# Patient Record
Sex: Male | Born: 2000 | Race: White | Hispanic: No | Marital: Single | Smoking: Never smoker
Health system: Southern US, Community
[De-identification: ages and names within clinical notes are randomized; demographics above are authoritative.]

## PROBLEM LIST (undated history)

## (undated) DIAGNOSIS — K219 Gastro-esophageal reflux disease without esophagitis: Secondary | ICD-10-CM

## (undated) DIAGNOSIS — M7989 Other specified soft tissue disorders: Secondary | ICD-10-CM

## (undated) DIAGNOSIS — R519 Headache, unspecified: Secondary | ICD-10-CM

## (undated) DIAGNOSIS — M549 Dorsalgia, unspecified: Secondary | ICD-10-CM

## (undated) DIAGNOSIS — M2550 Pain in unspecified joint: Secondary | ICD-10-CM

## (undated) HISTORY — DX: Gastro-esophageal reflux disease without esophagitis: K21.9

## (undated) HISTORY — DX: Headache, unspecified: R51.9

## (undated) HISTORY — DX: Other specified soft tissue disorders: M79.89

## (undated) HISTORY — DX: Dorsalgia, unspecified: M54.9

## (undated) HISTORY — DX: Pain in unspecified joint: M25.50

---
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
## 2018-03-22 DIAGNOSIS — Z6832 Body mass index (BMI) 32.0-32.9, adult: Secondary | ICD-10-CM | POA: Diagnosis not present

## 2018-03-22 DIAGNOSIS — Z1331 Encounter for screening for depression: Secondary | ICD-10-CM | POA: Diagnosis not present

## 2018-03-22 DIAGNOSIS — R591 Generalized enlarged lymph nodes: Secondary | ICD-10-CM | POA: Diagnosis not present

## 2018-09-13 DIAGNOSIS — H6693 Otitis media, unspecified, bilateral: Secondary | ICD-10-CM | POA: Diagnosis not present

## 2019-06-22 DIAGNOSIS — Z20828 Contact with and (suspected) exposure to other viral communicable diseases: Secondary | ICD-10-CM | POA: Diagnosis not present

## 2019-06-22 DIAGNOSIS — R519 Headache, unspecified: Secondary | ICD-10-CM | POA: Diagnosis not present

## 2019-06-22 DIAGNOSIS — R5383 Other fatigue: Secondary | ICD-10-CM | POA: Diagnosis not present

## 2019-07-04 DIAGNOSIS — B079 Viral wart, unspecified: Secondary | ICD-10-CM | POA: Diagnosis not present

## 2019-07-04 DIAGNOSIS — D485 Neoplasm of uncertain behavior of skin: Secondary | ICD-10-CM | POA: Diagnosis not present

## 2019-07-17 DIAGNOSIS — Z13220 Encounter for screening for lipoid disorders: Secondary | ICD-10-CM | POA: Diagnosis not present

## 2019-07-17 DIAGNOSIS — Z23 Encounter for immunization: Secondary | ICD-10-CM | POA: Diagnosis not present

## 2019-07-17 DIAGNOSIS — Z0000 Encounter for general adult medical examination without abnormal findings: Secondary | ICD-10-CM | POA: Diagnosis not present

## 2019-07-17 DIAGNOSIS — Z6833 Body mass index (BMI) 33.0-33.9, adult: Secondary | ICD-10-CM | POA: Diagnosis not present

## 2019-07-18 DIAGNOSIS — B079 Viral wart, unspecified: Secondary | ICD-10-CM | POA: Diagnosis not present

## 2019-08-18 DIAGNOSIS — R748 Abnormal levels of other serum enzymes: Secondary | ICD-10-CM | POA: Diagnosis not present

## 2019-08-18 DIAGNOSIS — R7989 Other specified abnormal findings of blood chemistry: Secondary | ICD-10-CM | POA: Diagnosis not present

## 2019-08-26 DIAGNOSIS — Z20828 Contact with and (suspected) exposure to other viral communicable diseases: Secondary | ICD-10-CM | POA: Diagnosis not present

## 2019-08-26 DIAGNOSIS — J3489 Other specified disorders of nose and nasal sinuses: Secondary | ICD-10-CM | POA: Diagnosis not present

## 2019-10-01 DIAGNOSIS — L299 Pruritus, unspecified: Secondary | ICD-10-CM | POA: Diagnosis not present

## 2019-10-01 DIAGNOSIS — L309 Dermatitis, unspecified: Secondary | ICD-10-CM | POA: Diagnosis not present

## 2021-05-28 HISTORY — PX: FOOT FRACTURE SURGERY: SHX645

## 2021-06-26 ENCOUNTER — Emergency Department
Admission: EM | Admit: 2021-06-26 | Discharge: 2021-06-26 | Disposition: A | Discharge status: Home / Self Care | Payer: No Typology Code available for payment source | Attending: Emergency Medicine | Admitting: Emergency Medicine

## 2021-06-26 ENCOUNTER — Emergency Department: Payer: No Typology Code available for payment source

## 2021-06-26 ENCOUNTER — Other Ambulatory Visit: Payer: Self-pay

## 2021-06-26 ENCOUNTER — Encounter: Payer: Self-pay | Admitting: Emergency Medicine

## 2021-06-26 DIAGNOSIS — S99912A Unspecified injury of left ankle, initial encounter: Secondary | ICD-10-CM | POA: Diagnosis present

## 2021-06-26 DIAGNOSIS — Y92331 Roller skating rink as the place of occurrence of the external cause: Secondary | ICD-10-CM | POA: Diagnosis not present

## 2021-06-26 DIAGNOSIS — S8262XA Displaced fracture of lateral malleolus of left fibula, initial encounter for closed fracture: Secondary | ICD-10-CM | POA: Diagnosis not present

## 2021-06-26 DIAGNOSIS — M25572 Pain in left ankle and joints of left foot: Secondary | ICD-10-CM

## 2021-06-26 DIAGNOSIS — Y9351 Activity, roller skating (inline) and skateboarding: Secondary | ICD-10-CM | POA: Insufficient documentation

## 2021-06-26 MED ORDER — OXYCODONE-ACETAMINOPHEN 5-325 MG PO TABS: 1.0000 | ORAL_TABLET | Freq: Four times a day (QID) | ORAL | 0 refills | Status: AC | PRN

## 2021-06-26 MED ORDER — ONDANSETRON 4 MG PO TBDP: 4.0000 mg | ORAL_TABLET | Freq: Three times a day (TID) | ORAL | 0 refills | Status: AC | PRN

## 2021-06-26 NOTE — Discharge Instructions (Signed)
You can take Percocet for pain. Please start stool softener.

## 2021-06-26 NOTE — ED Triage Notes (Signed)
Pt reports fell off a skateboard last pm and hurt his left ankle. Pt not able to put weight on it at this time.

## 2021-06-26 NOTE — ED Provider Notes (Signed)
[facility]-EMERGENCY DEPARTMENT  ____________________________________________  Time seen: Approximately 7:18 PM  I have reviewed the triage vital signs and the nursing notes.   HISTORY  Chief Complaint Ankle Pain   Historian Patient    HPI Larry [name] is a 20 y.o. male presents to the emergency department with acute left ankle pain after an inversion type ankle injury while patient was skateboarding in [place].  Patient has had difficulty bearing weight since injury occurred.  No abrasions or lacerations.  No other alleviating measures have been attempted.   History reviewed. No pertinent past medical history.   Immunizations up to date:  Yes.     History reviewed. No pertinent past medical history.  There are no problems to display for this patient.   History reviewed. No pertinent surgical history.  Prior to Admission medications   Medication Sig Start Date End Date Taking? Authorizing Provider  ondansetron (ZOFRAN ODT) 4 MG disintegrating tablet Take 1 tablet (4 mg total) by mouth every 8 (eight) hours as needed for up to 5 days. 06/26/21 07/01/21 Yes Pia Mau M, PA-C  oxyCODONE-acetaminophen (PERCOCET/ROXICET) 5-325 MG tablet Take 1 tablet by mouth every 6 (six) hours as needed for up to 3 days. 06/26/21 06/29/21 Yes Orvil Feil, PA-C    Allergies Patient has no allergy information on record.  No family history on file.  Social History     Review of Systems  Constitutional: No fever/chills Eyes:  No discharge ENT: No upper respiratory complaints. Respiratory: no cough. No SOB/ use of accessory muscles to breath Gastrointestinal:   No nausea, no vomiting.  No diarrhea.  No constipation. Musculoskeletal: Patient has left ankle pain.  Skin: Negative for rash, abrasions, lacerations, ecchymosis.    ____________________________________________   PHYSICAL EXAM:  VITAL SIGNS: ED Triage Vitals  Enc Vitals Group     BP 06/26/21 1700 (!)  157/91     Pulse Rate 06/26/21 1700 78     Resp 06/26/21 1700 20     Temp 06/26/21 1700 98.5 F (36.9 C)     Temp Source 06/26/21 1700 Oral     SpO2 06/26/21 1700 98 %     Weight --      Height --      Head Circumference --      Peak Flow --      Pain Score 06/26/21 1531 8     Pain Loc --      Pain Edu? --      Excl. in GC? --      Constitutional: Alert and oriented. Well appearing and in no acute distress. Eyes: Conjunctivae are normal. PERRL. EOMI. Head: Atraumatic. ENT:      Ears:       Nose: No congestion/rhinnorhea.      Mouth/Throat: Mucous membranes are moist.  Neck: No stridor.  No cervical spine tenderness to palpation. Cardiovascular: Normal rate, regular rhythm. Normal S1 and S2.  Good peripheral circulation. Respiratory: Normal respiratory effort without tachypnea or retractions. Lungs CTAB. Good air entry to the bases with no decreased or absent breath sounds Gastrointestinal: Bowel sounds x 4 quadrants. Soft and nontender to palpation. No guarding or rigidity. No distention. Musculoskeletal: Patient has pain with palpation over the lateral and medial aspect the left ankle.  Palpable dorsalis pedis pulse bilaterally and symmetrically.  Capillary refill less than 2 seconds on the left. Neurologic:  Normal for age. No gross focal neurologic deficits are appreciated.  Skin:  Skin is warm, dry and intact. No rash  noted. Psychiatric: Mood and affect are normal for age. Speech and behavior are normal.   ____________________________________________   LABS (all labs ordered are listed, but only abnormal results are displayed)  Labs Reviewed - No data to display ____________________________________________  EKG   ____________________________________________  RADIOLOGY Geraldo Pitter, personally viewed and evaluated these images (plain radiographs) as part of my medical decision making, as well as reviewing the written report by the radiologist.  DG Ankle  Complete Left  Result Date: 06/26/2021 CLINICAL DATA:  Status post fall, ankle pain EXAM: LEFT ANKLE COMPLETE - 3+ VIEW COMPARISON:  None. FINDINGS: Transverse fracture of the medial malleolus with 5 mm of lateral displacement. Oblique fracture of the distal fibular diaphysis with 4 mm of lateral displacement. 6 mm subluxation of the talar dome relative to the tibial plafond. No aggressive osseous lesion. Soft tissue swelling around the ankle. No radiopaque foreign body or soft tissue emphysema. IMPRESSION: 1. Transverse fracture of the medial malleolus with 5 mm of lateral displacement. 2. Oblique fracture of the distal fibular diaphysis with 4 mm of lateral displacement. 3. 6 mm lateral subluxation of the talar dome relative to the tibial plafond. Electronically Signed   By: Elige Ko M.D.   On: 06/26/2021 16:31    ____________________________________________    PROCEDURES  Procedure(s) performed:     Procedures     Medications - No data to display   ____________________________________________   INITIAL IMPRESSION / ASSESSMENT AND PLAN / ED COURSE  Pertinent labs & imaging results that were available during my care of the patient were reviewed by me and considered in my medical decision making (see chart for details).      Assessment and plan Ankle pain 20 year old male presents to the emergency department with acute left ankle pain after an inversion type ankle injury.  X-ray of the left ankle shows a transverse fracture of the medial malleolus and oblique fracture of the distal fibula.  I consulted on-call orthopedist, Dr. Allena Katz who recommended consult with podiatry.  I spoke with on-call podiatrist, Dr. Loreta Ave who agreed to see patient as an outpatient.  Percocet was prescribed for pain and patient was placed in a cam boot and crutches were provided.  All patient questions were answered.     ____________________________________________  FINAL CLINICAL IMPRESSION(S) /  ED DIAGNOSES  Final diagnoses:  Acute left ankle pain      NEW MEDICATIONS STARTED DURING THIS VISIT:  ED Discharge Orders          Ordered    oxyCODONE-acetaminophen (PERCOCET/ROXICET) 5-325 MG tablet  Every 6 hours PRN        06/26/21 1731    ondansetron (ZOFRAN ODT) 4 MG disintegrating tablet  Every 8 hours PRN        06/26/21 1731                This chart was dictated using voice recognition software/Dragon. Despite best efforts to proofread, errors can occur which can change the meaning. Any change was purely unintentional.     Orvil Feil, PA-C 06/26/21 Kathi Ludwig, MD 06/27/21 [phone]

## 2021-06-29 ENCOUNTER — Telehealth: Payer: Self-pay | Admitting: Urology

## 2021-06-29 ENCOUNTER — Encounter: Payer: Self-pay | Admitting: Podiatry

## 2021-06-29 ENCOUNTER — Ambulatory Visit: Payer: No Typology Code available for payment source | Admitting: Podiatry

## 2021-06-29 ENCOUNTER — Other Ambulatory Visit: Payer: Self-pay

## 2021-06-29 DIAGNOSIS — S93432A Sprain of tibiofibular ligament of left ankle, initial encounter: Secondary | ICD-10-CM

## 2021-06-29 DIAGNOSIS — S82842A Displaced bimalleolar fracture of left lower leg, initial encounter for closed fracture: Secondary | ICD-10-CM

## 2021-06-29 MED ORDER — OXYCODONE HCL 5 MG PO TABS: 5.0000 mg | ORAL_TABLET | ORAL | 0 refills | Status: DC | PRN

## 2021-06-29 MED ORDER — ACETAMINOPHEN 500 MG PO TABS: 1000.0000 mg | ORAL_TABLET | Freq: Four times a day (QID) | ORAL | 0 refills | Status: DC | PRN

## 2021-06-29 NOTE — Progress Notes (Signed)
  Subjective:  Patient ID: Larry [name], male    DOB: February 22, 2001,  MRN: [id number]  Chief Complaint  Patient presents with   Fracture    (np) left foot fracutre, possible sx    20 y.o. male presents with the above complaint. History confirmed with patient.  He fell and rolled his ankle while skateboarding late Saturday early Sunday.  He went to [facility] on Sunday x-rays were taken and they diagnosed with an ankle fracture and referred to [place].  He has been in a cam boot.  Has not been elevating as much as he thinks he may should have but he did not receive many post discharge care instructions such as ice and elevation from the ER.  No compression dressing was applied  Objective:  Physical Exam: warm, good capillary refill, no trophic changes or ulcerative lesions, normal DP and PT pulses, normal sensory exam, and no evidence of compartment syndrome.  Ecchymosis about the foot and ankle with pain on the medial and lateral malleoli   Radiographs: Multiple views x-ray of left ankle reviewed from ER he has a PER pattern bimalleolar fracture with diastases of the syndesmosis and lateral translation of the talus Assessment:   1. Bimalleolar ankle fracture, left, closed, initial encounter   2. Ankle syndesmosis disruption, left, initial encounter      Plan:  Patient was evaluated and treated and all questions answered.  Reviewed the radiographic and clinical exam findings in detail with the patient and his mother.  Discussed that the ankle fracture he has would best be served with operative treatment.  Discussed the risk benefits and potential complications of this including but not limited to pain, swelling, infection, scar, numbness which may be temporary or permanent, chronic pain, stiffness, nerve pain or damage, wound healing problems, bone healing problems including delayed or non-union.  He had quite a bit of edema today I applied a Radio broadcast assistant and multilayer compression  dressing with alternating Webril and Ace wrap as well as a posterior slab and AO sugar-tong style plaster splint to the left ankle today, he has crutches and is doing well and then will be nonweightbearing until surgery.  We will schedule surgery for this Friday advised him to keep the operative leg elevated all times and ice behind the knee 20 minutes every hour until surgery.  Discussed with him and his mother that there is a possibility that we may need to cancel the surgery Friday if he is still very swollen and delay until next week which they understand but we will determine this in preop when I see him.  We also discussed postoperative course including the period of nonweightbearing from 4 to 6 weeks pending his progress as well as postoperative physical therapy.  Advised he will need at least 2 months off work following surgery he works in [employer].   Surgical plan:  Procedure: -ORIF bimalleolar ankle fracture with repair syndesmotic disruption  Location: -[facility]  Anesthesia plan: -General anesthesia with regional block  Postoperative pain plan: - Tylenol 1000 mg every 6 hours, ibuprofen 600 mg every 8 hours, gabapentin 300 mg every 8 hours x5 days, oxycodone 5 mg 1-2 tabs every 6 hours only as needed  DVT prophylaxis: -ASA 325 mg following surgery  WB Restrictions / DME needs: -NWB in posterior splint and compression dressing  No follow-ups on file.

## 2021-06-29 NOTE — Telephone Encounter (Signed)
DOS - 07/01/21  OPEN TREATMENT BIMALLEOLAR ANKLE FX LEFT --- 77412 OPEN TREATMENT DISTAL TIBIOFIBULAR JOINT LEFT --- 87867   AETNA EFFECTIVE DATE - 11/27/19  PER AETNA'S AUTOMATIVE SYSTEM FOR CPT CODES 67209 AND 47096 NO PRIOR AUTH IS REQUIRED.   REF # [id number]

## 2021-07-01 ENCOUNTER — Other Ambulatory Visit: Payer: Self-pay | Admitting: Podiatry

## 2021-07-01 ENCOUNTER — Encounter: Payer: Self-pay | Admitting: Podiatry

## 2021-07-01 DIAGNOSIS — S93432A Sprain of tibiofibular ligament of left ankle, initial encounter: Secondary | ICD-10-CM | POA: Diagnosis not present

## 2021-07-01 DIAGNOSIS — S82842A Displaced bimalleolar fracture of left lower leg, initial encounter for closed fracture: Secondary | ICD-10-CM | POA: Diagnosis not present

## 2021-07-01 MED ORDER — OXYCODONE HCL 5 MG PO TABS: 5.0000 mg | ORAL_TABLET | ORAL | 0 refills | Status: AC | PRN

## 2021-07-01 MED ORDER — IBUPROFEN 600 MG PO TABS: 600.0000 mg | ORAL_TABLET | Freq: Four times a day (QID) | ORAL | 0 refills | Status: AC | PRN

## 2021-07-01 MED ORDER — GABAPENTIN 300 MG PO CAPS: 300.0000 mg | ORAL_CAPSULE | Freq: Three times a day (TID) | ORAL | 0 refills | Status: DC

## 2021-07-01 MED ORDER — ACETAMINOPHEN 500 MG PO TABS: 1000.0000 mg | ORAL_TABLET | Freq: Four times a day (QID) | ORAL | 0 refills | Status: AC | PRN

## 2021-07-01 NOTE — Progress Notes (Signed)
11/4 left ankle ORIF

## 2021-07-06 ENCOUNTER — Other Ambulatory Visit: Payer: Self-pay

## 2021-07-06 ENCOUNTER — Ambulatory Visit (INDEPENDENT_AMBULATORY_CARE_PROVIDER_SITE_OTHER): Payer: No Typology Code available for payment source

## 2021-07-06 ENCOUNTER — Ambulatory Visit: Payer: No Typology Code available for payment source

## 2021-07-06 ENCOUNTER — Ambulatory Visit (INDEPENDENT_AMBULATORY_CARE_PROVIDER_SITE_OTHER): Payer: No Typology Code available for payment source | Admitting: Podiatry

## 2021-07-06 DIAGNOSIS — S82842A Displaced bimalleolar fracture of left lower leg, initial encounter for closed fracture: Secondary | ICD-10-CM

## 2021-07-06 DIAGNOSIS — S82852A Displaced trimalleolar fracture of left lower leg, initial encounter for closed fracture: Secondary | ICD-10-CM

## 2021-07-06 DIAGNOSIS — S93432A Sprain of tibiofibular ligament of left ankle, initial encounter: Secondary | ICD-10-CM

## 2021-07-07 ENCOUNTER — Encounter: Payer: Self-pay | Admitting: Podiatry

## 2021-07-07 NOTE — Progress Notes (Signed)
  Subjective:  Patient ID: Larry [name], male    DOB: [birth year],  MRN: [id number]  Chief Complaint  Patient presents with   Routine Post Op     (xrays)POV #1 DOS 07/01/2021 OPEN REDUCTION AND REPAIR OF LEFT ANKLE FRACTURE    DOS: 07/01/2021 Procedure: Left trimalleolar ORIF without posterior lip fixation, syndesmotic repair  20 y.o. male returns for post-op check.  Overall doing well he had quite bit of pain the first couple of days but he is quickly decreasing his narcotic demand.  Having bowel movements and normal sensation in the foot.  Review of Systems: Negative except as noted in the HPI. Denies N/V/F/Ch.   Objective:  There were no vitals filed for this visit. There is no height or weight on file to calculate BMI. Constitutional Well developed. Well nourished.  Vascular Foot warm and well perfused. Capillary refill normal to all digits.   Neurologic Normal speech. Oriented to person, place, and time. Epicritic sensation to light touch grossly present bilaterally.  No signs of CRPS 1 or 2, no signs of compartment syndrome.  Good motor function to digits  Dermatologic Skin healing well without signs of infection. Skin edges well coapted without signs of infection.  Moderate amount of edema and ecchymosis as expected  Orthopedic: Tenderness to palpation noted about the surgical site.   Multiple view plain film radiographs: Status post ORIF of fibular medial malleolar fracture with screw and plate fixation and syndesmotic flexible fixation.  Joint alignment appears maintained and in good position.  The butterfly fragment in the mid fibula appears to have displaced some Assessment:   1. Trimalleolar fracture of left ankle, closed, initial encounter   2. Ankle syndesmosis disruption, left, initial encounter    Plan:  Patient was evaluated and treated and all questions answered.  S/p foot surgery left -Progressing as expected post-operatively. -XR: As above.  I reviewed  the radiographs with patient and his father -WB Status: NWB in splint with knee scooter -Sutures: Remove sutures and staples next visit. -Medications: No refills required he is weaning his pain medications and will let me know if he needs any refills -Foot redressed.  Well-padded below-knee posterior splint was applied with a 2 layer Jones compression dressing.  Plan to transition to a cam boot and next visit to begin nonweightbearing range of motion  No follow-ups on file.

## 2021-07-20 ENCOUNTER — Encounter: Payer: Self-pay | Admitting: Podiatry

## 2021-07-20 ENCOUNTER — Other Ambulatory Visit: Payer: Self-pay

## 2021-07-20 ENCOUNTER — Ambulatory Visit (INDEPENDENT_AMBULATORY_CARE_PROVIDER_SITE_OTHER): Payer: No Typology Code available for payment source | Admitting: Podiatry

## 2021-07-20 ENCOUNTER — Ambulatory Visit (INDEPENDENT_AMBULATORY_CARE_PROVIDER_SITE_OTHER): Payer: No Typology Code available for payment source

## 2021-07-20 DIAGNOSIS — S82852D Displaced trimalleolar fracture of left lower leg, subsequent encounter for closed fracture with routine healing: Secondary | ICD-10-CM

## 2021-07-20 DIAGNOSIS — S93432D Sprain of tibiofibular ligament of left ankle, subsequent encounter: Secondary | ICD-10-CM

## 2021-07-20 NOTE — Progress Notes (Signed)
  Subjective:  Patient ID: Larry [name], male    DOB: 04-23-2001,  MRN: [id number]  Chief Complaint  Patient presents with   Routine Post Op      POV #2 DOS 07/01/2021 OPEN REDUCTION AND REPAIR OF LEFT ANKLE FRACTURE    DOS: 07/01/2021 Procedure: Left trimalleolar ORIF without posterior lip fixation, syndesmotic repair  20 y.o. male returns for post-op check.  Doing very well he is having some swelling but minimal pain.  No longer taking any narcotics.  Review of Systems: Negative except as noted in the HPI. Denies N/V/F/Ch.   Objective:  There were no vitals filed for this visit. There is no height or weight on file to calculate BMI. Constitutional Well developed. Well nourished.  Vascular Foot warm and well perfused. Capillary refill normal to all digits.   Neurologic Normal speech. Oriented to person, place, and time. Epicritic sensation to light touch grossly present bilaterally.  No signs of CRPS 1 or 2, no signs of compartment syndrome.  Good motor function to digits  Dermatologic Skin healing well without signs of infection. Skin edges well coapted without signs of infection.  Edema much improved no ecchymosis, good healing of incisions  Orthopedic: Tenderness to palpation noted about the surgical site.   Multiple view plain film radiographs: Status post ORIF of fibular medial malleolar fracture with screw and plate fixation and syndesmotic flexible fixation.  Joint alignment appears maintained and in good position.  Interval radiographs since last visit show no change in alignment Assessment:   1. Closed trimalleolar fracture of left ankle with routine healing, subsequent encounter   2. Ankle syndesmosis disruption, left, subsequent encounter    Plan:  Patient was evaluated and treated and all questions answered.  S/p foot surgery left -Progressing as expected post-operatively. -XR: As above -WB Status: NWB in splint with knee scooter -Sutures: Staples and sutures  removed today.  He may begin bathing -Medications: No refills required he is weaning his pain medications and will let me know if he needs any refills -Ace wrap applied.  He may begin bathing.  Boot must be on for any up and moving about. -Begin gentle range of motion every other hour which I demonstrated for him -Plan to begin PT after next visit for NWB exercises, WB in boot at week 8 -New x-rays next visit  Return in about 3 weeks (around 08/10/2021) for post op (new x-rays).

## 2021-08-10 ENCOUNTER — Ambulatory Visit (INDEPENDENT_AMBULATORY_CARE_PROVIDER_SITE_OTHER): Payer: No Typology Code available for payment source

## 2021-08-10 ENCOUNTER — Ambulatory Visit (INDEPENDENT_AMBULATORY_CARE_PROVIDER_SITE_OTHER): Payer: No Typology Code available for payment source | Admitting: Podiatry

## 2021-08-10 ENCOUNTER — Other Ambulatory Visit: Payer: Self-pay

## 2021-08-10 DIAGNOSIS — S82852D Displaced trimalleolar fracture of left lower leg, subsequent encounter for closed fracture with routine healing: Secondary | ICD-10-CM | POA: Diagnosis not present

## 2021-08-11 ENCOUNTER — Encounter: Payer: Self-pay | Admitting: Podiatry

## 2021-08-11 NOTE — Progress Notes (Signed)
°  Subjective:  Patient ID: Larry [name], male    DOB: 2000/12/20,  MRN: [id number]  Chief Complaint  Patient presents with   Routine Post Op     (xray)POV #3 DOS 07/01/2021 OPEN REDUCTION AND REPAIR OF LEFT ANKLE FRACTURE    DOS: 07/01/2021 Procedure: Left trimalleolar ORIF without posterior lip fixation, syndesmotic repair  20 y.o. male returns for post-op check.  Doing very well swelling is improving has been moving the ankle up and down  Review of Systems: Negative except as noted in the HPI. Denies N/V/F/Ch.   Objective:  There were no vitals filed for this visit. There is no height or weight on file to calculate BMI. Constitutional Well developed. Well nourished.  Vascular Foot warm and well perfused. Capillary refill normal to all digits.   Neurologic Normal speech. Oriented to person, place, and time. Epicritic sensation to light touch grossly present bilaterally.  No signs of CRPS 1 or 2, no signs of compartment syndrome.  Good motor function to digits  Dermatologic Incisions are well-healed and not hypertrophic nontender  Orthopedic: Tenderness to palpation noted about the surgical site.   Multiple view plain film radiographs: Status post ORIF of fibular medial malleolar fracture with screw and plate fixation and syndesmotic flexible fixation.  Joint alignment appears maintained and in good position.  Interval radiographs since last visit show no change in alignment or hardware complication, butterfly fragment on fibula appears to be displaced but there is bridging across the fracture site of the fibula Assessment:   1. Closed trimalleolar fracture of left ankle with routine healing, subsequent encounter    Plan:  Patient was evaluated and treated and all questions answered.  S/p foot surgery left -Progressing as expected post-operatively. -XR: As above -WB Status: NWB in splint with knee scooter - Begin physical therapy for the next 2 weeks at [facility] which I  gave him a referral for. -In 2 weeks he can begin WBAT in the CAM boot -Should be able to transition to weightbearing in regular shoe gear at week 12.  I discussed with him and his father he hopefully will to return to work at that point  Return in about 4 weeks (around 09/07/2021) for post op (new x-rays).

## 2021-09-07 ENCOUNTER — Encounter: Payer: Self-pay | Admitting: Podiatry

## 2021-09-07 ENCOUNTER — Ambulatory Visit (INDEPENDENT_AMBULATORY_CARE_PROVIDER_SITE_OTHER): Payer: No Typology Code available for payment source | Admitting: Podiatry

## 2021-09-07 ENCOUNTER — Other Ambulatory Visit: Payer: Self-pay

## 2021-09-07 ENCOUNTER — Ambulatory Visit (INDEPENDENT_AMBULATORY_CARE_PROVIDER_SITE_OTHER): Payer: No Typology Code available for payment source

## 2021-09-07 DIAGNOSIS — S82852D Displaced trimalleolar fracture of left lower leg, subsequent encounter for closed fracture with routine healing: Secondary | ICD-10-CM

## 2021-09-07 NOTE — Patient Instructions (Signed)
Begin walking in the boot and no longer need to use knee scooter  In 4 weeks (week of 2/8) can transition out of the boot and wear an ankle brace to walk in

## 2021-09-09 NOTE — Progress Notes (Signed)
°  Subjective:  Patient ID: Larry [name], male    DOB: 2001-03-07,  MRN: [id number]  Chief Complaint  Patient presents with   Routine Post Op    )POV #3 DOS 07/01/2021 OPEN REDUCTION AND REPAIR OF LEFT ANKLE FRACTURE    DOS: 07/01/2021 Procedure: Left trimalleolar ORIF without posterior lip fixation, syndesmotic repair  21 y.o. male returns for post-op check.  Continues to improve he has very little pain.  He is beginning physical therapy.  Review of Systems: Negative except as noted in the HPI. Denies N/V/F/Ch.   Objective:  There were no vitals filed for this visit. There is no height or weight on file to calculate BMI. Constitutional Well developed. Well nourished.  Vascular Foot warm and well perfused. Capillary refill normal to all digits.   Neurologic Normal speech. Oriented to person, place, and time. Epicritic sensation to light touch grossly present bilaterally.  No signs of CRPS 1 or 2, no signs of compartment syndrome.  Good motor function to digits  Dermatologic Incisions are well-healed and not hypertrophic nontender  Orthopedic: Minimal edema and tenderness to palpation noted about the surgical site.  Has good early range of motion.   Multiple view plain film radiographs: Status post ORIF of fibular medial malleolar fracture with screw and plate fixation and syndesmotic flexible fixation.  Joint alignment appears maintained and in good position.  Interval radiographs since last visit show no change in alignment or hardware complication, butterfly fragment on fibula appears to be displaced but there is bridging across the fracture site of the fibula Assessment:   1. Closed trimalleolar fracture of left ankle with routine healing, subsequent encounter    Plan:  Patient was evaluated and treated and all questions answered.  S/p foot surgery left -Progressing as expected post-operatively. -XR: As above -WB Status: WBAT in CAM boot, he may transition to WBAT in an  ankle brace Tri-Lock in 4 weeks -Continue physical therapy can begin to do more range of motion and weightbearing exercise at this point -In 2 weeks he can begin WBAT in the CAM boot  Return in about 6 weeks (around 10/19/2021) for post op (new x-rays).

## 2021-10-19 ENCOUNTER — Ambulatory Visit (INDEPENDENT_AMBULATORY_CARE_PROVIDER_SITE_OTHER): Payer: No Typology Code available for payment source

## 2021-10-19 ENCOUNTER — Other Ambulatory Visit: Payer: Self-pay

## 2021-10-19 ENCOUNTER — Ambulatory Visit (INDEPENDENT_AMBULATORY_CARE_PROVIDER_SITE_OTHER): Payer: No Typology Code available for payment source | Admitting: Podiatry

## 2021-10-19 ENCOUNTER — Encounter: Payer: Self-pay | Admitting: Podiatry

## 2021-10-19 DIAGNOSIS — Z9889 Other specified postprocedural states: Secondary | ICD-10-CM

## 2021-10-19 DIAGNOSIS — S93432D Sprain of tibiofibular ligament of left ankle, subsequent encounter: Secondary | ICD-10-CM

## 2021-10-19 DIAGNOSIS — S82852D Displaced trimalleolar fracture of left lower leg, subsequent encounter for closed fracture with routine healing: Secondary | ICD-10-CM

## 2021-10-19 NOTE — Progress Notes (Signed)
°  Subjective:  Patient ID: Larry [name], male    DOB: [birth year],  MRN: [id number]  Chief Complaint  Patient presents with   Routine Post Op    "It's doing pretty good."    DOS: 07/01/2021 Procedure: Left trimalleolar ORIF without posterior lip fixation, syndesmotic repair  21 y.o. male returns for post-op check.  Presents today WBAT in a Tri-Lock ankle brace.  He went back to work this week.  Has been able to weight-bear without a brace for short distances in his home and says he feels well and feels stiff but not having any pain  Review of Systems: Negative except as noted in the HPI. Denies N/V/F/Ch.   Objective:  There were no vitals filed for this visit. There is no height or weight on file to calculate BMI. Constitutional Well developed. Well nourished.  Vascular Foot warm and well perfused. Capillary refill normal to all digits.   Neurologic Normal speech. Oriented to person, place, and time. Epicritic sensation to light touch grossly present bilaterally.  No signs of CRPS 1 or 2, no signs of compartment syndrome.  Good motor function to digits  Dermatologic Incisions are well-healed and not hypertrophic nontender  Orthopedic: Some limited dorsiflexion of the ankle joint.  He has no pain with range of motion or palpation medially or laterally   Multiple view plain film radiographs: Status post ORIF of fibular medial malleolar fracture with screw and plate fixation and syndesmotic flexible fixation.  Joint alignment appears maintained.  Interval healing across the fracture site is present Assessment:   1. Closed trimalleolar fracture of left ankle with routine healing, subsequent encounter   2. Post-operative state   3. Ankle syndesmosis disruption, left, subsequent encounter    Plan:  Patient was evaluated and treated and all questions answered.  S/p foot surgery left -Progressing as expected post-operatively. -XR: As above -May return to work -Transition out of  brace into supportive shoes and boots over the next month -Avoid impact activity for 6 months total from surgery  Return in about 2 months (around 12/17/2021) for fracture f/u (new x-rays).

## 2021-11-28 DIAGNOSIS — R2689 Other abnormalities of gait and mobility: Secondary | ICD-10-CM | POA: Diagnosis not present

## 2021-11-28 DIAGNOSIS — M25572 Pain in left ankle and joints of left foot: Secondary | ICD-10-CM | POA: Diagnosis not present

## 2021-12-05 DIAGNOSIS — M25572 Pain in left ankle and joints of left foot: Secondary | ICD-10-CM | POA: Diagnosis not present

## 2021-12-05 DIAGNOSIS — R2689 Other abnormalities of gait and mobility: Secondary | ICD-10-CM | POA: Diagnosis not present

## 2021-12-12 DIAGNOSIS — M25572 Pain in left ankle and joints of left foot: Secondary | ICD-10-CM | POA: Diagnosis not present

## 2021-12-12 DIAGNOSIS — R2689 Other abnormalities of gait and mobility: Secondary | ICD-10-CM | POA: Diagnosis not present

## 2021-12-19 DIAGNOSIS — M25572 Pain in left ankle and joints of left foot: Secondary | ICD-10-CM | POA: Diagnosis not present

## 2021-12-19 DIAGNOSIS — R2689 Other abnormalities of gait and mobility: Secondary | ICD-10-CM | POA: Diagnosis not present

## 2021-12-21 ENCOUNTER — Ambulatory Visit: Payer: BC Managed Care – PPO | Admitting: Podiatry

## 2021-12-21 ENCOUNTER — Ambulatory Visit (INDEPENDENT_AMBULATORY_CARE_PROVIDER_SITE_OTHER): Payer: BC Managed Care – PPO

## 2021-12-21 ENCOUNTER — Encounter: Payer: Self-pay | Admitting: Podiatry

## 2021-12-21 DIAGNOSIS — M25879 Other specified joint disorders, unspecified ankle and foot: Secondary | ICD-10-CM

## 2021-12-21 DIAGNOSIS — S82852D Displaced trimalleolar fracture of left lower leg, subsequent encounter for closed fracture with routine healing: Secondary | ICD-10-CM | POA: Diagnosis not present

## 2021-12-21 NOTE — Progress Notes (Signed)
?  Subjective:  ?Patient ID: Larry [name], male    DOB: 10/09/2000,  MRN: [id number] ? ?Chief Complaint  ?Patient presents with  ? Routine Post Op  ?   (xray)POV #4 DOS 07/01/2021 OPEN REDUCTION AND REPAIR OF ANKLE FX LT / X-RAY  ? ? ?DOS: 07/01/2021 ?Procedure: Left trimalleolar ORIF without posterior lip fixation, syndesmotic repair ? ?21 y.o. male returns for post-op check.  Doing well he has been back in his regular shoes.  Still wears the ankle brace at work for support.  Notices swelling on the weekend when is not wearing the brace.  He does have some pain in the ankle towards the end of the workday on the inside ? ?Review of Systems: Negative except as noted in the HPI. Denies N/V/F/Ch. ? ? ?Objective:  ?There were no vitals filed for this visit. ?There is no height or weight on file to calculate BMI. ?Constitutional Well developed. ?Well nourished.  ?Vascular Foot warm and well perfused. ?Capillary refill normal to all digits.   ?Neurologic Normal speech. ?Oriented to person, place, and time. ?Epicritic sensation to light touch grossly present bilaterally.  No signs of CRPS 1 or 2, no signs of compartment syndrome.  Good motor function to digits  ?Dermatologic Incisions are well-healed and not hypertrophic nontender  ?Orthopedic: Good smooth and pain-free range of motion of the ankle joint.  No pain with compression.  He does have slight pain with palpation to the anterior deltoid ligament  ? ?Multiple view plain film radiographs: New radiographs today show maintained alignment, there appears to be good healing of the minimally ulnar fragment.  The butterfly fragment has not fully healed on the fibular side ?Assessment:  ? ?1. Closed trimalleolar fracture of left ankle with routine healing, subsequent encounter   ?2. Impingement of ankle joint   ? ?Plan:  ?Patient was evaluated and treated and all questions answered. ? ?S/p foot surgery left ?-Overall doing fairly well he is now 6 months after his injury.   He is tolerating regular shoe gear and uses his brace him for work.  He may continue to do activity as tolerated at this point.  He does have some tenderness on the anteromedial ankle only to palpation none with range of motion and he says it does not bother him until later in the day with certain positions.  Possibly does have some impingement of fibers of the deltoid and the medial gutter.  Discussed with him if this is persistent we could consider arthroscopy would prefer to have some advanced imaging such as a CT scan or MRI prior to doing this to evaluate further.  We will let me know how he is doing otherwise I will see him as needed at this point. ? ?Return if symptoms worsen or fail to improve.  ? ?

## 2021-12-26 ENCOUNTER — Encounter: Payer: No Typology Code available for payment source | Admitting: Podiatry

## 2021-12-27 DIAGNOSIS — Z0000 Encounter for general adult medical examination without abnormal findings: Secondary | ICD-10-CM | POA: Diagnosis not present

## 2021-12-27 DIAGNOSIS — Z1331 Encounter for screening for depression: Secondary | ICD-10-CM | POA: Diagnosis not present

## 2021-12-27 DIAGNOSIS — G43909 Migraine, unspecified, not intractable, without status migrainosus: Secondary | ICD-10-CM | POA: Diagnosis not present

## 2022-01-17 ENCOUNTER — Ambulatory Visit: Payer: BC Managed Care – PPO | Admitting: Diagnostic Neuroimaging

## 2022-01-17 ENCOUNTER — Encounter: Payer: Self-pay | Admitting: *Deleted

## 2022-01-17 VITALS — BP 126/82 | HR 77 | Ht 73.0 in | Wt 293.0 lb

## 2022-01-17 DIAGNOSIS — R519 Headache, unspecified: Secondary | ICD-10-CM | POA: Diagnosis not present

## 2022-01-17 NOTE — Progress Notes (Signed)
[facility]  PATIENT: Larry [name] DOB: Apr 24, 2001  REFERRING CLINICIAN: Remo Lipps, PA HISTORY FROM: patient  REASON FOR VISIT: new consult   HISTORICAL  CHIEF COMPLAINT:  Chief Complaint  Patient presents with   New Patient (Initial Visit)    RM 7 with mom here for consult on worsening headaches/ migraines with associated  numbness. Pt sts he had an event while at work where his right side went numb and vision went blurry( event took place on 01/05/22). Also sts when headaches are present he becomes nauseated, will vomit, and is sensitivity to noise. He has tried otc meds helps some. Rx'd rizatriptan but has not had to take yet.    HISTORY OF PRESENT ILLNESS:   21 year old male here for evaluation of headaches.  December 08, 2021 patient had new onset of severe headache, light sensitivity, difficulty with words, dizziness, nausea and vomiting.  Patient has had 2 more episodes since that time.  Last episode in May occurred with some right-sided numbness and tingling.  Also had some vision changes on the left side but splotchy is in his visual field.  Has had some nausea and vomiting with these episodes.  No specific triggering factors.  No change in diet, exercise, sleep or stress.  No accidents injuries or traumas recently.  Some family history of headaches in mother.  Patient has been using over-the-counter medicines with mild relief.   REVIEW OF SYSTEMS: Full 14 system review of systems performed and negative with exception of: as per HPI.  ALLERGIES: No Known Allergies  HOME MEDICATIONS: Outpatient Medications Prior to Visit  Medication Sig Dispense Refill   rizatriptan (MAXALT) 10 MG tablet Take by mouth. (Patient not taking: Reported on 01/17/2022)     diclofenac (VOLTAREN) 50 MG EC tablet Take 50 mg by mouth 3 (three) times daily as needed.     gabapentin (NEURONTIN) 300 MG capsule Take 1 capsule (300 mg total) by mouth 3 (three) times daily for  7 days. 21 capsule 0   No facility-administered medications prior to visit.    PAST MEDICAL HISTORY: Past Medical History:  Diagnosis Date   Headache     PAST SURGICAL HISTORY: Past Surgical History:  Procedure Laterality Date   FOOT FRACTURE SURGERY Left 05/2021   ankle    FAMILY HISTORY: Family History  Problem Relation Age of Onset   Headache Mother    Hypertension Father    Breast cancer Paternal Grandmother    Hyperlipidemia Paternal Grandfather     SOCIAL HISTORY: Social History   Socioeconomic History   Marital status: Single    Spouse name: Not on file   Number of children: Not on file   Years of education: Not on file   Highest education level: Not on file  Occupational History    Comment: Holiday representative  Tobacco Use   Smoking status: Never    Passive exposure: Past   Smokeless tobacco: Current    Types: Snuff  Substance and Sexual Activity   Alcohol use: Yes    Comment: A FEW TIMES A WEEK   Drug use: Never   Sexual activity: Not on file  Other Topics Concern   Not on file  Social History Narrative   Right handed   Caffeine - 1 diet pepsi per day    Social Determinants of Health   Financial Resource Strain: Not on file  Food Insecurity: Not on file  Transportation Needs: Not on file  Physical Activity: Not on file  Stress: Not on file  Social Connections: Not on file  Intimate Partner Violence: Not on file     PHYSICAL EXAM  GENERAL EXAM/CONSTITUTIONAL: Vitals:  Vitals:   01/17/22 1115  BP: 126/82  Pulse: 77  SpO2: 98%  Weight: 293 lb (132.9 kg)  Height: 6\' 1"  (1.854 m)   Body mass index is 38.66 kg/m. Wt Readings from Last 3 Encounters:  01/17/22 293 lb (132.9 kg)   Patient is in no distress; well developed, nourished and groomed; neck is supple  CARDIOVASCULAR: Examination of carotid arteries is normal; no carotid bruits Regular rate and rhythm, no murmurs Examination of peripheral vascular system by observation and  palpation is normal  EYES: Ophthalmoscopic exam of optic discs and posterior segments is normal; no papilledema or hemorrhages No results found.  MUSCULOSKELETAL: Gait, strength, tone, movements noted in Neurologic exam below  NEUROLOGIC: MENTAL STATUS:      View : No data to display.         awake, alert, oriented to person, place and time recent and remote memory intact normal attention and concentration language fluent, comprehension intact, naming intact fund of knowledge appropriate  CRANIAL NERVE:  2nd - no papilledema on fundoscopic exam 2nd, 3rd, 4th, 6th - pupils equal and reactive to light, visual fields full to confrontation, extraocular muscles intact, no nystagmus; MILD LEFT PTOSIS 5th - facial sensation symmetric 7th - facial strength symmetric 8th - hearing intact 9th - palate elevates symmetrically, uvula midline 11th - shoulder shrug symmetric 12th - tongue protrusion midline  MOTOR:  normal bulk and tone, full strength in the BUE, BLE  SENSORY:  normal and symmetric to light touch, temperature, vibration  COORDINATION:  finger-nose-finger, fine finger movements normal  REFLEXES:  deep tendon reflexes present and symmetric  GAIT/STATION:  narrow based gait     DIAGNOSTIC DATA (LABS, IMAGING, TESTING) - I reviewed patient records, labs, notes, testing and imaging myself where available.  No results found for: WBC, HGB, HCT, MCV, PLT No results found for: NA, K, CL, CO2, GLUCOSE, BUN, CREATININE, CALCIUM, PROT, ALBUMIN, AST, ALT, ALKPHOS, BILITOT, GFRNONAA, GFRAA No results found for: CHOL, HDL, LDLCALC, LDLDIRECT, TRIG, CHOLHDL No results found for: 01/19/22 No results found for: VITAMINB12 No results found for: TSH     ASSESSMENT AND PLAN  21 y.o. year old male here with:   Dx:  1. New onset headache     PLAN:  NEW ONSET HEADACHE / NUMBNESS / NAUSEA / LEFT PTOSIS (possible migraine vs other causes) - check MRI brain / MRA  head (rule out vascular, mass, inflamm etiologies)  Orders Placed This Encounter  Procedures   MR BRAIN W WO CONTRAST   MR ANGIO HEAD WO CONTRAST   Return for pending if symptoms worsen or fail to improve, pending test results.    30, MD 01/17/2022, 11:41 AM Certified in Neurology, Neurophysiology and Neuroimaging  [facility] [address] [place], [place] [phone]

## 2022-01-24 ENCOUNTER — Telehealth: Payer: Self-pay | Admitting: Diagnostic Neuroimaging

## 2022-01-24 DIAGNOSIS — S00259A Superficial foreign body of unspecified eyelid and periocular area, initial encounter: Secondary | ICD-10-CM

## 2022-01-24 NOTE — Telephone Encounter (Signed)
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 45 mins MRI Brain w/wo contrast Dr. Theresa Mulligan Berkley Harvey: [id number] exp. 01/24/22-02/22/22 scheduled at [facility] 01/31/22 at 3pm 30 mins MRA head wo contrast Dr. Theresa Mulligan Berkley Harvey: [id number] exp. 01/24/22-02/22/22 scheduled at [facility] 02/01/22 at 1pm Has to be different days per [facility]. I told patient to pay $75 at check in for MRI and nothing for MRA.

## 2022-01-26 NOTE — Telephone Encounter (Signed)
Please place order for x ray at [place]. Thank you

## 2022-01-30 NOTE — Telephone Encounter (Addendum)
Order for x-ray has been placed.

## 2022-01-30 NOTE — Addendum Note (Signed)
Addended by: Ann Maki on: 01/30/2022 07:45 AM   Modules accepted: Orders

## 2022-01-31 ENCOUNTER — Other Ambulatory Visit: Payer: Self-pay | Admitting: Diagnostic Neuroimaging

## 2022-01-31 ENCOUNTER — Other Ambulatory Visit: Payer: BC Managed Care – PPO

## 2022-01-31 ENCOUNTER — Ambulatory Visit
Admission: RE | Admit: 2022-01-31 | Discharge: 2022-01-31 | Disposition: A | Discharge status: Home / Self Care | Payer: Self-pay | Source: Ambulatory Visit | Attending: Diagnostic Neuroimaging | Admitting: Diagnostic Neuroimaging

## 2022-01-31 DIAGNOSIS — S00259A Superficial foreign body of unspecified eyelid and periocular area, initial encounter: Secondary | ICD-10-CM

## 2022-02-01 ENCOUNTER — Other Ambulatory Visit: Payer: BC Managed Care – PPO

## 2022-02-07 ENCOUNTER — Ambulatory Visit: Payer: BC Managed Care – PPO

## 2022-02-07 DIAGNOSIS — R519 Headache, unspecified: Secondary | ICD-10-CM

## 2022-02-07 MED ORDER — GADOBENATE DIMEGLUMINE 529 MG/ML IV SOLN
20.0000 mL | Freq: Once | INTRAVENOUS | Status: AC | PRN
  Administered 2022-02-07: 20 mL via INTRAVENOUS

## 2022-02-08 ENCOUNTER — Ambulatory Visit (INDEPENDENT_AMBULATORY_CARE_PROVIDER_SITE_OTHER): Payer: BC Managed Care – PPO

## 2022-02-08 DIAGNOSIS — R519 Headache, unspecified: Secondary | ICD-10-CM

## 2022-02-17 ENCOUNTER — Telehealth: Payer: Self-pay

## 2022-03-27 ENCOUNTER — Encounter: Payer: Self-pay | Admitting: Podiatry

## 2022-04-04 DIAGNOSIS — W294XXA Contact with nail gun, initial encounter: Secondary | ICD-10-CM | POA: Diagnosis not present

## 2022-04-04 DIAGNOSIS — Z23 Encounter for immunization: Secondary | ICD-10-CM | POA: Diagnosis not present

## 2022-04-04 DIAGNOSIS — Y990 Civilian activity done for income or pay: Secondary | ICD-10-CM | POA: Diagnosis not present

## 2022-04-04 DIAGNOSIS — W458XXA Other foreign body or object entering through skin, initial encounter: Secondary | ICD-10-CM | POA: Diagnosis not present

## 2022-04-04 DIAGNOSIS — S6992XA Unspecified injury of left wrist, hand and finger(s), initial encounter: Secondary | ICD-10-CM | POA: Diagnosis not present

## 2022-04-04 DIAGNOSIS — S61442A Puncture wound with foreign body of left hand, initial encounter: Secondary | ICD-10-CM | POA: Diagnosis not present

## 2022-04-04 DIAGNOSIS — S60552A Superficial foreign body of left hand, initial encounter: Secondary | ICD-10-CM | POA: Diagnosis not present

## 2022-04-05 DIAGNOSIS — S01511A Laceration without foreign body of lip, initial encounter: Secondary | ICD-10-CM | POA: Diagnosis not present

## 2022-04-11 DIAGNOSIS — M205X2 Other deformities of toe(s) (acquired), left foot: Secondary | ICD-10-CM | POA: Diagnosis not present

## 2022-04-11 DIAGNOSIS — M205X1 Other deformities of toe(s) (acquired), right foot: Secondary | ICD-10-CM | POA: Diagnosis not present

## 2022-04-17 ENCOUNTER — Ambulatory Visit (INDEPENDENT_AMBULATORY_CARE_PROVIDER_SITE_OTHER): Payer: BC Managed Care – PPO

## 2022-04-17 ENCOUNTER — Encounter: Payer: Self-pay | Admitting: Podiatry

## 2022-04-17 ENCOUNTER — Ambulatory Visit (INDEPENDENT_AMBULATORY_CARE_PROVIDER_SITE_OTHER): Payer: BC Managed Care – PPO | Admitting: Podiatry

## 2022-04-17 DIAGNOSIS — S82852D Displaced trimalleolar fracture of left lower leg, subsequent encounter for closed fracture with routine healing: Secondary | ICD-10-CM | POA: Diagnosis not present

## 2022-04-17 DIAGNOSIS — M25879 Other specified joint disorders, unspecified ankle and foot: Secondary | ICD-10-CM | POA: Diagnosis not present

## 2022-04-17 DIAGNOSIS — Z9889 Other specified postprocedural states: Secondary | ICD-10-CM

## 2022-04-17 MED ORDER — DICLOFENAC SODIUM 75 MG PO TBEC: 75.0000 mg | DELAYED_RELEASE_TABLET | Freq: Two times a day (BID) | ORAL | 2 refills | Status: AC

## 2022-04-18 NOTE — Progress Notes (Signed)
  Subjective:  Patient ID: Larry [name], male    DOB: July 24, 2001,  MRN: [id number]  Chief Complaint  Patient presents with   Routine Post Op    "It's still hurting me where the hardware is."    21 y.o. male presents with the above complaint. History confirmed with patient.  All the pain seems on the medial ankle, there is stiffness laterally and in the front of the ankle but does not hurt as much.  He notices especially when he is on his feet more at work.  Still in regular shoe gear, he has taken a significant amount of ibuprofen when it is painful  Objective:  Physical Exam: warm, good capillary refill, no trophic changes or ulcerative lesions, normal DP and PT pulses, normal sensory exam, and mild edema left medial ankle with tenderness over the superficial and deep deltoid complex and distal portion of the incision, no pain on the lateral fibula or lateral gutter or lateral ankle ligaments, limited range of motion in dorsiflexion.   Radiographs: Multiple views x-ray of left ankle: Hardware that is intact there is some subsidence of the anterior medial malleolar fragment and the suture button syndesmotic repair Assessment:   1. Post-operative state   2. Impingement of ankle joint   3. Closed trimalleolar fracture of left ankle with routine healing, subsequent encounter      Plan:  Patient was evaluated and treated and all questions answered.  I reviewed the results of his x-rays today.  Most of his pain is along the medial malleolar fragment, he does have some evidence of deltoid insufficiency and impingement of the medial malleolar fragment in the medial gutter as well as the hardware.  I discussed removal of hardware on the medial side, expect likely he will need repair of the deltoid and possible excision of this fracture fragment which is malunited.  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  Likely will need arthroscopy the same time to evaluate the joint and debride any impinging structures.  CT has been ordered for surgical planning and I will see him back after this for review.  Rx for diclofenac twice daily sent for now, he will also take Tylenol as needed  Return for after CT to review.

## 2022-04-26 ENCOUNTER — Ambulatory Visit
Admission: RE | Admit: 2022-04-26 | Discharge: 2022-04-26 | Disposition: A | Discharge status: Home / Self Care | Payer: Self-pay | Source: Ambulatory Visit | Attending: Podiatry | Admitting: Podiatry

## 2022-04-26 DIAGNOSIS — S82852D Displaced trimalleolar fracture of left lower leg, subsequent encounter for closed fracture with routine healing: Secondary | ICD-10-CM

## 2022-04-26 DIAGNOSIS — S82402A Unspecified fracture of shaft of left fibula, initial encounter for closed fracture: Secondary | ICD-10-CM | POA: Diagnosis not present

## 2022-04-26 DIAGNOSIS — M25879 Other specified joint disorders, unspecified ankle and foot: Secondary | ICD-10-CM

## 2022-05-29 ENCOUNTER — Encounter: Payer: Self-pay | Admitting: Podiatry

## 2022-05-29 ENCOUNTER — Ambulatory Visit: Payer: BC Managed Care – PPO | Admitting: Podiatry

## 2022-05-29 DIAGNOSIS — S82852K Displaced trimalleolar fracture of left lower leg, subsequent encounter for closed fracture with nonunion: Secondary | ICD-10-CM

## 2022-05-30 ENCOUNTER — Telehealth: Payer: Self-pay

## 2022-05-30 NOTE — Telephone Encounter (Signed)
DOS 06/07/2022  REPAIR/REVISION ANKLE JOINT LT - 27726 REPAIR NON UNION LT - 27724  [employer] EFFECTIVE DATE - 11/26/2021  PLAN DEDUCTIBLE - $500.00 W/ $0.00 REMAINING OUT OF POCKET - $6350.00 W/ $4741.49 REMAINING COPAY $0.00 COINSURANCE - 20% PER SERVICE YEAR  SPOKE TO SHONTAE S AT [employer], SHE STATED NO PRECERT REQUIRED. CALL REF # [id number] S 05/30/2022 3:39PM EST

## 2022-05-31 NOTE — Progress Notes (Signed)
  Subjective:  Patient ID: Larry [name], male    DOB: 23-Mar-2001,  MRN: [id number]  Chief Complaint  Patient presents with   Foot Pain    "It's not good."  Mom states, "We need to talk to him about another surgery."    21 y.o. male presents with the above complaint. History confirmed with patient.  He returns for follow-up after his CT scan is still quite painful for him  Objective:  Physical Exam: warm, good capillary refill, no trophic changes or ulcerative lesions, normal DP and PT pulses, normal sensory exam, and mild edema left medial ankle with tenderness over the superficial and deep deltoid complex and distal portion of the incision, no pain on the lateral fibula or lateral gutter or lateral ankle ligaments, limited range of motion in dorsiflexion.   Radiographs: Multiple views x-ray of left ankle: Hardware that is intact there is some subsidence of the anterior medial malleolar fragment and the suture button syndesmotic repair, diastases of the syndesmosis is noted   CT 04/26/2022 IMPRESSION: 1. Ununited fibular shaft fracture. 2. Ununited medial medial malleolus fracture. The distal screw in the fractured tip of the medial malleolus is loose. 3. Largely healed posterior malleolus fracture. 4. Widening of the tibiofibular joint space and medial subluxation of the tibia in relation to the talus a maximum of 7 mm. This may suggest failure of the tightrope fixation. 5. 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     Electronically Signed   By: Marijo Sanes M.D.   On: 04/27/2022 14:35   Assessment:   1. 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  Reviewed the results of his CT and his images today with the patient and his mother.  We discussed the presence of the nonunion and malunited alignment of the ankle joint.  I  discussed with him that at his age I do not think that leaving this with a long-term brace or an ankle fusion would be in his best interest.  I do think revision of the fracture nonunions are in his best interest.  We discussed the risk benefits and potential complications of the procedure including not limited to  pain, swelling, infection, scar, numbness which may be temporary or permanent, chronic pain, stiffness, nerve pain or damage, wound healing problems, bone healing problems including delayed or non-union.  We also discussed the possibility and likelihood of eventual arthritis in this ankle joint.  We discussed the recovery process and the period of nonweightbearing he likely will be nonweightbearing for approximately 8 weeks.  Informed consent was signed and reviewed.  All questions were addressed.  No guarantees to the outcome of surgery were made    Surgical plan:  Procedure: -Revision of left ankle ORIF nonunion with removal of hardware and bone graft from heel and likely allograft  Location: -[facility]  Anesthesia plan: -General anesthesia with regional block  Postoperative pain plan: - Tylenol 1000 mg every 6 hours, gabapentin 300 mg every 8 hours x5 days, oxycodone 5 mg 1-2 tabs every 6 hours only as needed  DVT prophylaxis: -ASA 325 mg twice daily  WB Restrictions / DME needs: -NWB in splint postop     No follow-ups on file.

## 2022-06-06 DIAGNOSIS — Z01818 Encounter for other preprocedural examination: Secondary | ICD-10-CM | POA: Diagnosis not present

## 2022-06-07 ENCOUNTER — Other Ambulatory Visit: Payer: Self-pay | Admitting: Podiatry

## 2022-06-07 DIAGNOSIS — Y929 Unspecified place or not applicable: Secondary | ICD-10-CM | POA: Diagnosis not present

## 2022-06-07 DIAGNOSIS — G8918 Other acute postprocedural pain: Secondary | ICD-10-CM | POA: Diagnosis not present

## 2022-06-07 DIAGNOSIS — X58XXXA Exposure to other specified factors, initial encounter: Secondary | ICD-10-CM | POA: Diagnosis not present

## 2022-06-07 DIAGNOSIS — S82842K Displaced bimalleolar fracture of left lower leg, subsequent encounter for closed fracture with nonunion: Secondary | ICD-10-CM | POA: Diagnosis not present

## 2022-06-07 DIAGNOSIS — S82852K Displaced trimalleolar fracture of left lower leg, subsequent encounter for closed fracture with nonunion: Secondary | ICD-10-CM | POA: Diagnosis not present

## 2022-06-07 DIAGNOSIS — M76822 Posterior tibial tendinitis, left leg: Secondary | ICD-10-CM | POA: Diagnosis not present

## 2022-06-07 DIAGNOSIS — S82842P Displaced bimalleolar fracture of left lower leg, subsequent encounter for closed fracture with malunion: Secondary | ICD-10-CM | POA: Diagnosis not present

## 2022-06-07 DIAGNOSIS — S93432A Sprain of tibiofibular ligament of left ankle, initial encounter: Secondary | ICD-10-CM | POA: Diagnosis not present

## 2022-06-07 MED ORDER — ACETAMINOPHEN 500 MG PO TABS: 1000.0000 mg | ORAL_TABLET | Freq: Four times a day (QID) | ORAL | 0 refills | Status: AC | PRN

## 2022-06-07 MED ORDER — ASPIRIN 325 MG PO TBEC: 325.0000 mg | DELAYED_RELEASE_TABLET | Freq: Two times a day (BID) | ORAL | 0 refills | Status: AC

## 2022-06-07 MED ORDER — OXYCODONE HCL 5 MG PO TABS: 5.0000 mg | ORAL_TABLET | ORAL | 0 refills | Status: AC | PRN

## 2022-06-07 MED ORDER — IBUPROFEN 600 MG PO TABS: 600.0000 mg | ORAL_TABLET | Freq: Three times a day (TID) | ORAL | 0 refills | Status: AC | PRN

## 2022-06-07 MED ORDER — GABAPENTIN 300 MG PO CAPS: 300.0000 mg | ORAL_CAPSULE | Freq: Three times a day (TID) | ORAL | 0 refills | Status: DC

## 2022-06-07 NOTE — Progress Notes (Signed)
10/11 L ankle ORIF revision

## 2022-06-08 ENCOUNTER — Other Ambulatory Visit: Payer: Self-pay | Admitting: Podiatry

## 2022-06-08 MED ORDER — CEPHALEXIN 500 MG PO CAPS: 500.0000 mg | ORAL_CAPSULE | Freq: Three times a day (TID) | ORAL | 0 refills | Status: AC

## 2022-06-14 ENCOUNTER — Ambulatory Visit (INDEPENDENT_AMBULATORY_CARE_PROVIDER_SITE_OTHER): Payer: BC Managed Care – PPO | Admitting: Podiatry

## 2022-06-14 ENCOUNTER — Ambulatory Visit (INDEPENDENT_AMBULATORY_CARE_PROVIDER_SITE_OTHER): Payer: BC Managed Care – PPO

## 2022-06-14 DIAGNOSIS — S93432D Sprain of tibiofibular ligament of left ankle, subsequent encounter: Secondary | ICD-10-CM | POA: Diagnosis not present

## 2022-06-14 DIAGNOSIS — Z9889 Other specified postprocedural states: Secondary | ICD-10-CM | POA: Diagnosis not present

## 2022-06-14 DIAGNOSIS — S82852K Displaced trimalleolar fracture of left lower leg, subsequent encounter for closed fracture with nonunion: Secondary | ICD-10-CM

## 2022-06-14 NOTE — Progress Notes (Signed)
  Subjective:  Patient ID: Larry [name], male    DOB: 2001/01/06,  MRN: [id number]  Chief Complaint  Patient presents with   Routine Post Op    POV #1 DOS 06/07/2022 LT ANKLE FRACTURE REVISION W/BONE GRAFT FROM HEEL      21 y.o. male returns for post-op check.  He is doing okay he has not taken oxycodone in 2 days, his pain and postoperative course has been much better than the first time he had surgery.  Review of Systems: Negative except as noted in the HPI. Denies N/V/F/Ch.   Objective:  There were no vitals filed for this visit. There is no height or weight on file to calculate BMI. Constitutional Well developed. Well nourished.  Vascular Foot warm and well perfused. Capillary refill normal to all digits.  Calf is soft and supple, no posterior calf or knee pain, negative Homans' sign  Neurologic Normal speech. Oriented to person, place, and time. Epicritic sensation to light touch grossly present bilaterally.  Dermatologic Skin healing well without signs of infection. Skin edges well coapted without signs of infection.  Orthopedic: Tenderness to palpation noted about the surgical site.  Moderate edema   Multiple view plain film radiographs: Status post revision ORIF of fibula and tibia with bone graft, all hardware intact and equivalent to immediate postoperative films there is adequate length of the fibula, presence of the temporary Kirschner wires through the graft on the medial side is unchanged Assessment:   1. 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 Post-operative state   3. Ankle syndesmosis disruption, left, subsequent encounter    Plan:  Patient was evaluated and treated and all questions answered.  S/p foot surgery left -Progressing as expected post-operatively.  We discussed the radiographs together as well as the intraoperative findings with him.  I discussed that a successful lengthening of the fibula with the  graft and revision of the hardware including the screw fixation across the syndesmosis.  We also discussed the need to excise the malunited fragment within the ankle joint on the medial malleolus and bone graft placed here to maintain length of the medial gutter.  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  Discussed this as well with his family who was present addressed all his questions -XR: Taken today and reviewed with patient -WB Status: NWB in posterior splint.  We will change this at next visit and then transition to a cast until week 6 -Sutures: Staples will be removed in 2 weeks. -Medications: No refills required.  He will continue his ASA 325 mg twice daily -Foot redressed.  Well-padded posterior below-knee splint was applied as well as Aquacel incisional dressing  Return in about 2 weeks (around 06/28/2022) for post op (no x-rays), staple removal.

## 2022-06-28 ENCOUNTER — Ambulatory Visit (INDEPENDENT_AMBULATORY_CARE_PROVIDER_SITE_OTHER): Payer: BC Managed Care – PPO | Admitting: Podiatry

## 2022-06-28 DIAGNOSIS — S82852K Displaced trimalleolar fracture of left lower leg, subsequent encounter for closed fracture with nonunion: Secondary | ICD-10-CM | POA: Diagnosis not present

## 2022-06-28 DIAGNOSIS — Z9889 Other specified postprocedural states: Secondary | ICD-10-CM

## 2022-06-29 NOTE — Progress Notes (Signed)
  Subjective:  Patient ID: Larry [name], male    DOB: [birth year],  MRN: [id number]  Chief Complaint  Patient presents with   Routine Post Op    POV #2 DOS 06/07/2022 LT ANKLE FRACTURE REVISION W/BONE GRAFT FROM HEEL - suture and staple removal, cast application      21 y.o. male returns for post-op check.  Says he is doing well has been icing and elevating the pain is improving Review of Systems: Negative except as noted in the HPI. Denies N/V/F/Ch.   Objective:  There were no vitals filed for this visit. There is no height or weight on file to calculate BMI. Constitutional Well developed. Well nourished.  Vascular Foot warm and well perfused. Capillary refill normal to all digits.  Calf is soft and supple, no posterior calf or knee pain, negative Homans' sign  Neurologic Normal speech. Oriented to person, place, and time. Epicritic sensation to light touch grossly present bilaterally.  Dermatologic Skin healing well without signs of infection. Skin edges well coapted without signs of infection.  Orthopedic: Tenderness to palpation noted about the surgical site.  Moderate edema   Multiple view plain film radiographs: Status post revision ORIF of fibula and tibia with bone graft, all hardware intact and equivalent to immediate postoperative films there is adequate length of the fibula, presence of the temporary Kirschner wires through the graft on the medial side is unchanged Assessment:   1. 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 Post-operative state    Plan:  Patient was evaluated and treated and all questions answered.  S/p foot surgery left -Overall progressing as expected.  Splint and dressings were removed today and all staples were removed and sutures removed.  Well-padded below-knee fiberglass cast was reapplied.  I will see him back in 3 weeks for new x-rays.  If we are seeing stability and healing of the medial malleolar  graft will be able to pull Kirschner wires at that point and transition to boot.  May need an additional 2 weeks and take to week 8.  For now continue ASA 325 mg twice daily  Return in about 3 weeks (around 07/19/2022) for cast removal , post op (new x-rays), possible pin pull.

## 2022-07-06 ENCOUNTER — Telehealth: Payer: Self-pay | Admitting: *Deleted

## 2022-07-06 NOTE — Telephone Encounter (Signed)
When did this message come through

## 2022-07-06 NOTE — Telephone Encounter (Signed)
"  My son had ankle surgery yesterday with Dr. Lilian Kapur.  We do not have the 1000mg  Tylenol prescription.  I'm calling to see if that can be called in.  We have the Ibuprofen, Gabapentin, Aspirin and the Oxy and the antibiotic but we don't have the Tylenol."  (Old message on the nurse voicemail in [place])

## 2022-07-07 NOTE — Telephone Encounter (Signed)
This message was on the [employer] on 06/08/2022.  See contacts

## 2022-07-19 ENCOUNTER — Ambulatory Visit (INDEPENDENT_AMBULATORY_CARE_PROVIDER_SITE_OTHER): Payer: BC Managed Care – PPO | Admitting: Podiatry

## 2022-07-19 ENCOUNTER — Ambulatory Visit (INDEPENDENT_AMBULATORY_CARE_PROVIDER_SITE_OTHER): Payer: BC Managed Care – PPO

## 2022-07-19 DIAGNOSIS — S82852D Displaced trimalleolar fracture of left lower leg, subsequent encounter for closed fracture with routine healing: Secondary | ICD-10-CM

## 2022-07-19 DIAGNOSIS — S82852K Displaced trimalleolar fracture of left lower leg, subsequent encounter for closed fracture with nonunion: Secondary | ICD-10-CM | POA: Diagnosis not present

## 2022-07-19 NOTE — Progress Notes (Signed)
  Subjective:  Patient ID: Larry [name], male    DOB: [birth year],  MRN: [id number]  Chief Complaint  Patient presents with   Routine Post Op    cast removal(xray)POV #3 DOS 06/07/2022 LT ANKLE FRACTURE REVISION W/BONE GRAFT FROM HEEL      21 y.o. male returns for post-op check.  Says he is doing well has been icing and elevating the pain is improving Review of Systems: Negative except as noted in the HPI. Denies N/V/F/Ch.   Objective:  There were no vitals filed for this visit. There is no height or weight on file to calculate BMI. Constitutional Well developed. Well nourished.  Vascular Foot warm and well perfused. Capillary refill normal to all digits.  Calf is soft and supple, no posterior calf or knee pain, negative Homans' sign  Neurologic Normal speech. Oriented to person, place, and time. Epicritic sensation to light touch grossly present bilaterally.  Dermatologic Incisions well-healed nonhypertrophic  Orthopedic: Still has moderate edema.  Has tenderness around the pin sites.  No pain on the medial or lateral ankle to deep palpation   Multiple view plain film radiographs: There is unchanged alignment of hardware in the joint, there seems to be good consolidation across the bone grafting site Assessment:   1. 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 Closed trimalleolar fracture of left ankle with routine healing, subsequent encounter    Plan:  Patient was evaluated and treated and all questions answered.  S/p foot surgery left -Seems to be healing well still.  Cast was removed today and he was transition back to a cam walker boot he will remain nonweightbearing for additional 3 weeks.  In 2 weeks he may begin range of motion.  The Kirschner wires removed uneventfully.  We will plan to begin physical therapy in 4 weeks  Return in about 3 weeks (around 08/09/2022) for post op (new x-rays).

## 2022-08-07 ENCOUNTER — Encounter: Payer: Self-pay | Admitting: Podiatry

## 2022-08-09 ENCOUNTER — Ambulatory Visit (INDEPENDENT_AMBULATORY_CARE_PROVIDER_SITE_OTHER): Payer: BC Managed Care – PPO | Admitting: Podiatry

## 2022-08-09 ENCOUNTER — Ambulatory Visit (INDEPENDENT_AMBULATORY_CARE_PROVIDER_SITE_OTHER): Payer: BC Managed Care – PPO

## 2022-08-09 DIAGNOSIS — S82852K Displaced trimalleolar fracture of left lower leg, subsequent encounter for closed fracture with nonunion: Secondary | ICD-10-CM

## 2022-08-09 NOTE — Patient Instructions (Signed)
You can begin to put weight on the foot in the boot. Use crutches and a scale to estimate 25% of your body weight on the foot. Then in 1 week do 50%, then 75% the week after, then 100% the week after. For therapy you can focus on range of motion and strengthening. No jumps, cuts, impact activity for now

## 2022-08-10 NOTE — Progress Notes (Signed)
  Subjective:  Patient ID: Larry [name], male    DOB: 2001/05/03,  MRN: [id number]  Chief Complaint  Patient presents with   Routine Post Op    DOS 06/07/2022 LT ANKLE FRACTURE REVISION W/BONE GRAFT FROM HEEL - new xrays today           21 y.o. male returns for post-op check.    Review of Systems: Negative except as noted in the HPI. Denies N/V/F/Ch.   Objective:  There were no vitals filed for this visit. There is no height or weight on file to calculate BMI. Constitutional Well developed. Well nourished.  Vascular Foot warm and well perfused. Capillary refill normal to all digits.  Calf is soft and supple, no posterior calf or knee pain, negative Homans' sign  Neurologic Normal speech. Oriented to person, place, and time. Epicritic sensation to light touch grossly present bilaterally.  Dermatologic Incisions well-healed nonhypertrophic  Orthopedic: Edema has improved.  He has good range of motion of the ankle joint.  No pain to palpation of the lateral fibula or on the medial malleolus there is some sensitivity to light touch   Multiple view plain film radiographs: New films taken today does show degenerative changes in the medial shoulder of the ankle but good healing across the bone grafting site on the medial malleolus as well as the fibula Assessment:   1. 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  S/p foot surgery left -Radiographs show continued stability alignment and good healing.  I think he can begin gradual weightbearing in the boot with crutches.  I advised him to begin with 25% body weightbearing for the first week, 50% the next week, 75% the next week and then fourth week he may begin full body weightbearing.  He will begin physical therapy focusing on strengthening and range of motion at first.  I will see him back in 1 month for new  x-rays  Return in about 1 month (around 09/09/2022) for post op (new x-rays).

## 2022-08-16 DIAGNOSIS — R262 Difficulty in walking, not elsewhere classified: Secondary | ICD-10-CM | POA: Diagnosis not present

## 2022-08-16 DIAGNOSIS — M25572 Pain in left ankle and joints of left foot: Secondary | ICD-10-CM | POA: Diagnosis not present

## 2022-08-30 DIAGNOSIS — R262 Difficulty in walking, not elsewhere classified: Secondary | ICD-10-CM | POA: Diagnosis not present

## 2022-08-30 DIAGNOSIS — M25572 Pain in left ankle and joints of left foot: Secondary | ICD-10-CM | POA: Diagnosis not present

## 2022-09-01 DIAGNOSIS — M25572 Pain in left ankle and joints of left foot: Secondary | ICD-10-CM | POA: Diagnosis not present

## 2022-09-01 DIAGNOSIS — R262 Difficulty in walking, not elsewhere classified: Secondary | ICD-10-CM | POA: Diagnosis not present

## 2022-09-04 DIAGNOSIS — M25572 Pain in left ankle and joints of left foot: Secondary | ICD-10-CM | POA: Diagnosis not present

## 2022-09-04 DIAGNOSIS — R262 Difficulty in walking, not elsewhere classified: Secondary | ICD-10-CM | POA: Diagnosis not present

## 2022-09-06 DIAGNOSIS — R262 Difficulty in walking, not elsewhere classified: Secondary | ICD-10-CM | POA: Diagnosis not present

## 2022-09-06 DIAGNOSIS — M25572 Pain in left ankle and joints of left foot: Secondary | ICD-10-CM | POA: Diagnosis not present

## 2022-09-07 DIAGNOSIS — R000 Tachycardia, unspecified: Secondary | ICD-10-CM | POA: Diagnosis not present

## 2022-09-12 DIAGNOSIS — M25572 Pain in left ankle and joints of left foot: Secondary | ICD-10-CM | POA: Diagnosis not present

## 2022-09-12 DIAGNOSIS — R262 Difficulty in walking, not elsewhere classified: Secondary | ICD-10-CM | POA: Diagnosis not present

## 2022-09-15 DIAGNOSIS — R262 Difficulty in walking, not elsewhere classified: Secondary | ICD-10-CM | POA: Diagnosis not present

## 2022-09-15 DIAGNOSIS — M25572 Pain in left ankle and joints of left foot: Secondary | ICD-10-CM | POA: Diagnosis not present

## 2022-09-18 ENCOUNTER — Ambulatory Visit (INDEPENDENT_AMBULATORY_CARE_PROVIDER_SITE_OTHER): Payer: BC Managed Care – PPO

## 2022-09-18 ENCOUNTER — Ambulatory Visit: Payer: BC Managed Care – PPO | Admitting: Podiatry

## 2022-09-18 DIAGNOSIS — S82852K Displaced trimalleolar fracture of left lower leg, subsequent encounter for closed fracture with nonunion: Secondary | ICD-10-CM

## 2022-09-18 DIAGNOSIS — E669 Obesity, unspecified: Secondary | ICD-10-CM

## 2022-09-18 NOTE — Patient Instructions (Signed)
Over the next 4 weeks begin gradual weightbearing in the ankle brace and a supportive shoe (high top like a hiking or work boot)  For Therapy: can begin WB exercises in ankle brace immediately, gradually as tolerated. Begin with resistive exercise and add partial to full body weight gradually as tolerated

## 2022-09-19 NOTE — Progress Notes (Signed)
  Subjective:  Patient ID: Larry [name], male    DOB: November 14, 2000,  MRN: [id number]  Chief Complaint  Patient presents with   Routine Post Op    POV LEFT ANKLE FX 06/07/2022      22 y.o. male returns for post-op check.  Overall doing well has some pain medially where the plate is, some swelling as well.  Therapy is going well  Review of Systems: Negative except as noted in the HPI. Denies N/V/F/Ch.   Objective:  There were no vitals filed for this visit. There is no height or weight on file to calculate BMI. Constitutional Well developed. Well nourished.  Vascular Foot warm and well perfused. Capillary refill normal to all digits.  Calf is soft and supple, no posterior calf or knee pain, negative Homans' sign  Neurologic Normal speech. Oriented to person, place, and time. Epicritic sensation to light touch grossly present bilaterally.  Dermatologic Incisions well-healed nonhypertrophic  Orthopedic: Edema has improved.  He has good range of motion of the ankle joint.  No pain with range of motion.  No pain with direct compression.  No pain to palpation of the lateral fibula, he does have some sensitivity over the medial malleolus near the plate and incision   Multiple view plain film radiographs: New films taken today does show degenerative changes in the medial shoulder of the ankle but good healing across Rexer sites, hardware intact and without complication Assessment:   1. 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 Obesity (BMI 35.0-39.9 without comorbidity)    Plan:  Patient was evaluated and treated and all questions answered.  S/p foot surgery left -He can continue to increase gradual weightbearing and begin transitioning gradually to the lace up Tri-Lock ankle brace that he has and a supportive shoe such as a hiking boot or tall work boots that crosses the ankle joint.  Would like him to continue therapy.  We also discussed that  his mother's request the impact of his weight on load on the joint and healing.  I offered referral to healthy weight and wellness to explore medical options for weight loss and even nutrition planning.  This referral was placed.  I will see him back in 6 weeks for new x-rays hopefully can transition to hightop boot only at that point and regular activity  Return in about 6 weeks (around 10/30/2022) for post op (new x-rays).

## 2022-09-20 DIAGNOSIS — M25572 Pain in left ankle and joints of left foot: Secondary | ICD-10-CM | POA: Diagnosis not present

## 2022-09-20 DIAGNOSIS — R262 Difficulty in walking, not elsewhere classified: Secondary | ICD-10-CM | POA: Diagnosis not present

## 2022-09-22 DIAGNOSIS — R262 Difficulty in walking, not elsewhere classified: Secondary | ICD-10-CM | POA: Diagnosis not present

## 2022-09-22 DIAGNOSIS — M25572 Pain in left ankle and joints of left foot: Secondary | ICD-10-CM | POA: Diagnosis not present

## 2022-09-26 DIAGNOSIS — M25572 Pain in left ankle and joints of left foot: Secondary | ICD-10-CM | POA: Diagnosis not present

## 2022-09-26 DIAGNOSIS — R262 Difficulty in walking, not elsewhere classified: Secondary | ICD-10-CM | POA: Diagnosis not present

## 2022-09-29 DIAGNOSIS — M25572 Pain in left ankle and joints of left foot: Secondary | ICD-10-CM | POA: Diagnosis not present

## 2022-09-29 DIAGNOSIS — R262 Difficulty in walking, not elsewhere classified: Secondary | ICD-10-CM | POA: Diagnosis not present

## 2022-10-03 DIAGNOSIS — M25572 Pain in left ankle and joints of left foot: Secondary | ICD-10-CM | POA: Diagnosis not present

## 2022-10-03 DIAGNOSIS — R262 Difficulty in walking, not elsewhere classified: Secondary | ICD-10-CM | POA: Diagnosis not present

## 2022-10-06 DIAGNOSIS — R262 Difficulty in walking, not elsewhere classified: Secondary | ICD-10-CM | POA: Diagnosis not present

## 2022-10-06 DIAGNOSIS — M25572 Pain in left ankle and joints of left foot: Secondary | ICD-10-CM | POA: Diagnosis not present

## 2022-10-10 DIAGNOSIS — M25572 Pain in left ankle and joints of left foot: Secondary | ICD-10-CM | POA: Diagnosis not present

## 2022-10-10 DIAGNOSIS — R262 Difficulty in walking, not elsewhere classified: Secondary | ICD-10-CM | POA: Diagnosis not present

## 2022-10-12 DIAGNOSIS — M25572 Pain in left ankle and joints of left foot: Secondary | ICD-10-CM | POA: Diagnosis not present

## 2022-10-12 DIAGNOSIS — R262 Difficulty in walking, not elsewhere classified: Secondary | ICD-10-CM | POA: Diagnosis not present

## 2022-10-16 DIAGNOSIS — M25572 Pain in left ankle and joints of left foot: Secondary | ICD-10-CM | POA: Diagnosis not present

## 2022-10-16 DIAGNOSIS — R262 Difficulty in walking, not elsewhere classified: Secondary | ICD-10-CM | POA: Diagnosis not present

## 2022-10-18 DIAGNOSIS — M25572 Pain in left ankle and joints of left foot: Secondary | ICD-10-CM | POA: Diagnosis not present

## 2022-10-18 DIAGNOSIS — R262 Difficulty in walking, not elsewhere classified: Secondary | ICD-10-CM | POA: Diagnosis not present

## 2022-10-23 DIAGNOSIS — R262 Difficulty in walking, not elsewhere classified: Secondary | ICD-10-CM | POA: Diagnosis not present

## 2022-10-23 DIAGNOSIS — M25572 Pain in left ankle and joints of left foot: Secondary | ICD-10-CM | POA: Diagnosis not present

## 2022-10-25 DIAGNOSIS — M25572 Pain in left ankle and joints of left foot: Secondary | ICD-10-CM | POA: Diagnosis not present

## 2022-10-25 DIAGNOSIS — R262 Difficulty in walking, not elsewhere classified: Secondary | ICD-10-CM | POA: Diagnosis not present

## 2022-10-30 ENCOUNTER — Ambulatory Visit (INDEPENDENT_AMBULATORY_CARE_PROVIDER_SITE_OTHER): Payer: BC Managed Care – PPO | Admitting: Podiatry

## 2022-10-30 ENCOUNTER — Encounter: Payer: Self-pay | Admitting: Podiatry

## 2022-10-30 ENCOUNTER — Ambulatory Visit (INDEPENDENT_AMBULATORY_CARE_PROVIDER_SITE_OTHER): Payer: BC Managed Care – PPO

## 2022-10-30 VITALS — BP 128/75 | HR 73

## 2022-10-30 DIAGNOSIS — Z9889 Other specified postprocedural states: Secondary | ICD-10-CM | POA: Diagnosis not present

## 2022-10-30 DIAGNOSIS — S82852K Displaced trimalleolar fracture of left lower leg, subsequent encounter for closed fracture with nonunion: Secondary | ICD-10-CM | POA: Diagnosis not present

## 2022-10-30 NOTE — Patient Instructions (Signed)
Call  [phone] to schedule the consultation with weight management

## 2022-10-31 NOTE — Progress Notes (Signed)
  Subjective:  Patient ID: Larry [name], male    DOB: 06/20/2001,  MRN: [id number]  Chief Complaint  Patient presents with   Routine Post Op    "It's pretty good."      22 y.o. male returns for post-op check.  He notes is improving.  He is ambulating in boots and the Tri-Lock ankle brace.  Swelling seems to be better.  Still has some pain on the inside of the ankle on the medial side near the plate.  Not able to walk on uneven ground, lift heavy loads or climb stairs or ladders  Review of Systems: Negative except as noted in the HPI. Denies N/V/F/Ch.   Objective:   Vitals:   10/30/22 1355  BP: 128/75  Pulse: 73   There is no height or weight on file to calculate BMI. Constitutional Well developed. Well nourished.  Vascular Foot warm and well perfused. Capillary refill normal to all digits.  Calf is soft and supple, no posterior calf or knee pain, negative Homans' sign  Neurologic Normal speech. Oriented to person, place, and time. Epicritic sensation to light touch grossly present bilaterally.  Dermatologic Incisions well-healed nonhypertrophic  Orthopedic: Edema has improved once again.  He has good range of motion of the ankle joint.  No pain with range of motion.  No pain with direct compression.  No pain to palpation of the lateral fibula, he does have some sensitivity over the medial malleolus near the plate and incision   Multiple view plain film radiographs: Fracture repair sites appear to be well-healed.  Prominence of plate on the medial malleolus, unchanged alignment and quality of ankle joint Assessment:   1. 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  S/p foot surgery left -Continues to improve.  He may continue regular activity.  He does have difficulty with long periods of time on his foot as well as lifting loads and stairs.  He may go back to work  with a lighter duty status and with these restrictions to avoid loads and uneven ground.  A letter was written for him for this.  I will see him back in 3 months to evaluate.  Long-term we discussed removal of the medial hardware and ankle arthroscopy.  He has upcoming appoint with healthy weight and wellness  Return in about 3 months (around 01/30/2023) for follow up ankle surgery.

## 2022-11-01 DIAGNOSIS — R262 Difficulty in walking, not elsewhere classified: Secondary | ICD-10-CM | POA: Diagnosis not present

## 2022-11-01 DIAGNOSIS — M25572 Pain in left ankle and joints of left foot: Secondary | ICD-10-CM | POA: Diagnosis not present

## 2022-11-07 ENCOUNTER — Encounter: Payer: Self-pay | Admitting: Nurse Practitioner

## 2022-11-07 ENCOUNTER — Ambulatory Visit (INDEPENDENT_AMBULATORY_CARE_PROVIDER_SITE_OTHER): Payer: BC Managed Care – PPO | Admitting: Nurse Practitioner

## 2022-11-07 VITALS — BP 149/82 | HR 75 | Temp 98.2°F | Ht 73.0 in | Wt 306.0 lb

## 2022-11-07 DIAGNOSIS — R030 Elevated blood-pressure reading, without diagnosis of hypertension: Secondary | ICD-10-CM | POA: Diagnosis not present

## 2022-11-07 DIAGNOSIS — Z6841 Body Mass Index (BMI) 40.0 and over, adult: Secondary | ICD-10-CM

## 2022-11-07 DIAGNOSIS — Z0289 Encounter for other administrative examinations: Secondary | ICD-10-CM

## 2022-11-07 DIAGNOSIS — M25572 Pain in left ankle and joints of left foot: Secondary | ICD-10-CM | POA: Diagnosis not present

## 2022-11-07 DIAGNOSIS — R262 Difficulty in walking, not elsewhere classified: Secondary | ICD-10-CM | POA: Diagnosis not present

## 2022-11-07 NOTE — Progress Notes (Signed)
Office: [phone]  /  Fax: [phone]   Initial Visit  Larry [name] was seen in clinic today to evaluate for obesity. He is interested in losing weight to improve overall health and reduce the risk of weight related complications. He presents today to review program treatment options, initial physical assessment, and evaluation.     He was referred by: Specialist  When asked what else they would like to accomplish? He states: Adopt healthier eating patterns, Improve energy levels and physical activity, and Lose a target amount of weight : Goal weight 250 lbs  Weight history:  He started gaining weight as a child.  His weight got worse when he broke his ankle and had surgery in 2022 and 2023.  He works in Architect.   When asked how has your weight affected you? He states: Contributed to orthopedic problems or mobility issues and Problems with depression and or anxiety  Some associated conditions: Other: history of migraines  Contributing factors: Family history, Eating patterns, and Life event  Weight promoting medications identified: None  Current nutrition plan: None  Current level of physical activity: Other: rowing machine, farm-cows  Current or previous pharmacotherapy: None  Response to medication: Never tried medications   Past medical history includes:   Past Medical History:  Diagnosis Date   Headache      Objective:   BP (!) 149/82   Pulse 75   Temp 98.2 F (36.8 C)   Ht '6\' 1"'$  (1.854 m)   Wt (!) 306 lb (138.8 kg)   SpO2 96%   BMI 40.37 kg/m  He was weighed on the bioimpedance scale: Body mass index is 40.37 kg/m.  Peak Weight:310 lbs , Body Fat%:37.7, Visceral Fat Rating:18, Weight trend over the last 12 months: Increasing  General:  Alert, oriented and cooperative. Patient is in no acute distress.  Respiratory: Normal respiratory effort, no problems with respiration noted   Gait: able to ambulate independently  Mental Status: Normal mood  and affect. Normal behavior. Normal judgment and thought content.   DIAGNOSTIC DATA REVIEWED:  BMET No results found for: "NA", "K", "CL", "CO2", "GLUCOSE", "BUN", "CREATININE", "CALCIUM", "GFRNONAA", "GFRAA" No results found for: "HGBA1C" No results found for: "INSULIN" CBC No results found for: "WBC", "RBC", "HGB", "HCT", "PLT", "MCV", "MCH", "MCHC", "RDW" Iron/TIBC/Ferritin/ %Sat No results found for: "IRON", "TIBC", "FERRITIN", "IRONPCTSAT" Lipid Panel  No results found for: "CHOL", "TRIG", "HDL", "CHOLHDL", "VLDL", "LDLCALC", "LDLDIRECT" Hepatic Function Panel  No results found for: "PROT", "ALBUMIN", "AST", "ALT", "ALKPHOS", "BILITOT", "BILIDIR", "IBILI" No results found for: "TSH"   Assessment and Plan:   Elevated BP without diagnosis of hypertension BP is elevated today.  Never been on meds.  Will continue to monitor.  Morbid obesity ([place])  BMI 40.0-44.9, adult ([place])        Obesity Treatment / Action Plan:  Patient will work on garnering support from family and friends to begin weight loss journey. Will work on eliminating or reducing the presence of highly palatable, calorie dense foods in the home. Will complete provided nutritional and psychosocial assessment questionnaire before the next appointment. Will be scheduled for indirect calorimetry to determine resting energy expenditure in a fasting state.  This will allow [place] to create a reduced calorie, high-protein meal plan to promote loss of fat mass while preserving muscle mass.  Obesity Education Performed Today:  He was weighed on the bioimpedance scale and results were discussed and documented in the synopsis.  We discussed obesity as a disease and the  importance of a more detailed evaluation of all the factors contributing to the disease.  We discussed the importance of long term lifestyle changes which include nutrition, exercise and behavioral modifications as well as the importance of customizing this  to his specific health and social needs.  We discussed the benefits of reaching a healthier weight to alleviate the symptoms of existing conditions and reduce the risks of the biomechanical, metabolic and psychological effects of obesity.  Larry [name] appears to be in the action stage of change and states they are ready to start intensive lifestyle modifications and behavioral modifications.  30+ minutes was spent today on this visit including the above counseling, pre-visit chart review, and post-visit documentation.  Reviewed by clinician on day of visit: allergies, medications, problem list, medical history, surgical history, family history, social history, and previous encounter notes pertinent to obesity diagnosis.    Ailene Rud Mahin Guardia FNP-C

## 2022-11-13 ENCOUNTER — Encounter: Payer: Self-pay | Admitting: Nurse Practitioner

## 2022-11-21 DIAGNOSIS — R262 Difficulty in walking, not elsewhere classified: Secondary | ICD-10-CM | POA: Diagnosis not present

## 2022-11-21 DIAGNOSIS — M25572 Pain in left ankle and joints of left foot: Secondary | ICD-10-CM | POA: Diagnosis not present

## 2022-11-27 ENCOUNTER — Ambulatory Visit: Payer: BC Managed Care – PPO | Admitting: Bariatrics

## 2022-11-27 ENCOUNTER — Encounter: Payer: Self-pay | Admitting: Bariatrics

## 2022-11-27 VITALS — BP 160/81 | HR 56 | Temp 97.7°F | Ht 73.0 in | Wt 320.0 lb

## 2022-11-27 DIAGNOSIS — R5383 Other fatigue: Secondary | ICD-10-CM | POA: Diagnosis not present

## 2022-11-27 DIAGNOSIS — Z6841 Body Mass Index (BMI) 40.0 and over, adult: Secondary | ICD-10-CM

## 2022-11-27 DIAGNOSIS — Z0000 Encounter for general adult medical examination without abnormal findings: Secondary | ICD-10-CM | POA: Insufficient documentation

## 2022-11-27 DIAGNOSIS — R0602 Shortness of breath: Secondary | ICD-10-CM | POA: Diagnosis not present

## 2022-11-27 DIAGNOSIS — E559 Vitamin D deficiency, unspecified: Secondary | ICD-10-CM | POA: Diagnosis not present

## 2022-11-27 DIAGNOSIS — R030 Elevated blood-pressure reading, without diagnosis of hypertension: Secondary | ICD-10-CM | POA: Diagnosis not present

## 2022-11-27 DIAGNOSIS — R7309 Other abnormal glucose: Secondary | ICD-10-CM

## 2022-11-27 DIAGNOSIS — Z1331 Encounter for screening for depression: Secondary | ICD-10-CM

## 2022-11-28 DIAGNOSIS — R262 Difficulty in walking, not elsewhere classified: Secondary | ICD-10-CM | POA: Diagnosis not present

## 2022-11-28 DIAGNOSIS — M25572 Pain in left ankle and joints of left foot: Secondary | ICD-10-CM | POA: Diagnosis not present

## 2022-11-28 NOTE — Progress Notes (Unsigned)
Chief Complaint:   OBESITY Larry [name] (MR# [id number]) is a 22 y.o. male who presents for evaluation and treatment of obesity and related comorbidities. Current BMI is Body mass index is 42.22 kg/m. Larry [name] has been struggling with his weight for many years and has been unsuccessful in either losing weight, maintaining weight loss, or reaching his healthy weight goal.  Larry [name] is here for his initial visit, but was here on 11/07/2022 for an information session.  He sometimes likes the clinic.  His surgeon recommended him for ankle surgery.  Larry [name] is currently in the action stage of change and ready to dedicate time achieving and maintaining a healthier weight. Larry [name] is interested in becoming our patient and working on intensive lifestyle modifications including (but not limited to) diet and exercise for weight loss.  Larry [name]'s habits were reviewed today and are as follows: His family eats meals together, he thinks his family will eat healthier with him, he struggles with family and or coworkers weight loss sabotage, his desired weight loss is 70 lbs, he has been heavy most of his life, he started gaining weight in the last 6 months, his heaviest weight ever was 320 pounds, he has significant food cravings issues, he skips meals frequently, he is frequently drinking liquids with calories, he frequently makes poor food choices, he frequently eats larger portions than normal, and he struggles with emotional eating.  Depression Screen Larry [name]'s Food and Mood (modified PHQ-9) score was 5.  Subjective:   1. Other fatigue Larry [name] admits to daytime somnolence and admits to waking up still tired. Patient has a history of symptoms of daytime fatigue and morning fatigue. Larry [name] generally gets  6-10  hours of sleep per night, and states that he has nightime awakenings. Snoring is present. Apneic episodes are not present. Epworth Sleepiness Score is 0.   2. SOB (shortness of breath) on  exertion Larry [name] notes increasing shortness of breath with exercising and seems to be worsening over time with weight gain. He notes getting out of breath sooner with activity than he used to. This has not gotten worse recently. Larry [name] denies shortness of breath at rest or orthopnea.  3. Elevated BP without diagnosis of hypertension Blood pressure 160/81.  4. Elevated glucose Patient is not on any medication.  Patient does not have a history of diabetes melitis.  5. Health care maintenance Obesity.  6. Vitamin D deficiency Patient is not taking any vitamins.  Assessment/Plan:   1. Other fatigue Larry [name] does feel that his weight is causing his energy to be lower than it should be. Fatigue may be related to obesity, depression or many other causes. Labs will be ordered, and in the meanwhile, Larry [name] will focus on self care including making healthy food choices, increasing physical activity and focusing on stress reduction.  - EKG 12-Lead - TSH+T4F+T3Free  2. SOB (shortness of breath) on exertion Larry [name] does feel that he gets out of breath more easily that he used to when he exercises. Larry [name]'s shortness of breath appears to be obesity related and exercise induced. He has agreed to work on weight loss and gradually increase exercise to treat his exercise induced shortness of breath. Will continue to monitor closely.  - TSH+T4F+T3Free  3. Elevated BP without diagnosis of hypertension We will watch blood pressure.  Discussed hypertension and weight loss.  4. Elevated glucose Check labs today.  - Hemoglobin A1c - Insulin, random  5. Health care maintenance Check labs, IC, EKG today.  - Hemoglobin A1c -  Insulin, random - Lipid Panel With LDL/HDL Ratio - TSH+T4F+T3Free - VITAMIN D 25 Hydroxy (Vit-D Deficiency, Fractures) - Comprehensive metabolic panel  6. Vitamin D deficiency Check labs today.  - VITAMIN D 25 Hydroxy (Vit-D Deficiency, Fractures)  7. Depression  screening Larry [name] had a positive depression screening. Depression is commonly associated with obesity and often results in emotional eating behaviors. We will monitor this closely and work on CBT to help improve the non-hunger eating patterns. Referral to Psychology may be required if no improvement is seen as he continues in our clinic.  8. Morbid obesity Check labs today.  - Hemoglobin A1c - Insulin, random - Lipid Panel With LDL/HDL Ratio - TSH+T4F+T3Free - VITAMIN D 25 Hydroxy (Vit-D Deficiency, Fractures) - Comprehensive metabolic panel  9. BMI 40.0-44.9, adult Larry [name] is currently in the action stage of change and his goal is to continue with weight loss efforts. I recommend Larry [name] begin the structured treatment plan as follows:  He has agreed to {MWMwtlossportion/plan2:23431}.  1.  Meal planning. 2.  Decrease fast food. 3.  Will not skip meals.  Exercise goals: All adults should avoid inactivity. Some physical activity is better than none, and adults who participate in any amount of physical activity gain some health benefits.   Behavioral modification strategies: increasing lean protein intake, decreasing simple carbohydrates, increasing vegetables, increasing water intake, decreasing eating out, no skipping meals, meal planning and cooking strategies, keeping healthy foods in the home, and planning for success.  He was informed of the importance of frequent follow-up visits to maximize his success with intensive lifestyle modifications for his multiple health conditions. He was informed we would discuss his lab results at his next visit unless there is a critical issue that needs to be addressed sooner. [name] agreed to keep his next visit at the agreed upon time to discuss these results.  Objective:   Blood pressure (!) 160/81, pulse (!) 56, temperature 97.7 F (36.5 C), height 6\' 1"  (1.854 m), weight (!) 320 lb (145.2 kg), SpO2 98 %. Body mass index is 42.22 kg/m.  EKG:  Normal sinus rhythm, rate 70 bpm..  Indirect Calorimeter completed today shows a VO2 of 413 and a REE of 2851.  His calculated basal metabolic rate is [id number] thus his basal metabolic rate is better than expected.  General: Cooperative, alert, well developed, in no acute distress. HEENT: Conjunctivae and lids unremarkable. Cardiovascular: Regular rhythm.  Lungs: Normal work of breathing. Neurologic: No focal deficits.   No results found for: "CREATININE", "BUN", "NA", "K", "CL", "CO2" No results found for: "ALT", "AST", "GGT", "ALKPHOS", "BILITOT" No results found for: "HGBA1C" No results found for: "INSULIN" No results found for: "TSH" No results found for: "CHOL", "HDL", "LDLCALC", "LDLDIRECT", "TRIG", "CHOLHDL" No results found for: "WBC", "HGB", "HCT", "MCV", "PLT" No results found for: "IRON", "TIBC", "FERRITIN"  Attestation Statements:   Reviewed by clinician on day of visit: allergies, medications, problem list, medical history, surgical history, family history, social history, and previous encounter notes.  Delfina Redwood, am acting as Location manager for [employer], DO.  I have reviewed the above documentation for accuracy and completeness, and I agree with the above. - ***

## 2022-11-29 ENCOUNTER — Encounter (INDEPENDENT_AMBULATORY_CARE_PROVIDER_SITE_OTHER): Payer: Self-pay | Admitting: Bariatrics

## 2022-11-29 ENCOUNTER — Encounter: Payer: Self-pay | Admitting: Bariatrics

## 2022-11-29 DIAGNOSIS — E88819 Insulin resistance, unspecified: Secondary | ICD-10-CM | POA: Insufficient documentation

## 2022-11-29 LAB — TSH+T4F+T3FREE
Free T4: 1.33 ng/dL (ref 0.82–1.77)
T3, Free: 3.3 pg/mL (ref 2.0–4.4)
TSH: 1.82 u[IU]/mL (ref 0.450–4.500)

## 2022-11-29 LAB — LIPID PANEL WITH LDL/HDL RATIO
Cholesterol, Total: 159 mg/dL (ref 100–199)
HDL: 44 mg/dL (ref >39)
LDL Chol Calc (NIH): 76 mg/dL (ref 0–99)
LDL/HDL Ratio: 1.7 ratio (ref 0.0–3.6)
Triglycerides: 238 mg/dL — ABNORMAL HIGH (ref 0–149)
VLDL Cholesterol Cal: 39 mg/dL (ref 5–40)

## 2022-11-29 LAB — COMPREHENSIVE METABOLIC PANEL
ALT: 40 IU/L (ref 0–44)
AST: 23 IU/L (ref 0–40)
Albumin/Globulin Ratio: 2 (ref 1.2–2.2)
Albumin: 4.7 g/dL (ref 4.3–5.2)
Alkaline Phosphatase: 96 IU/L (ref 44–121)
BUN/Creatinine Ratio: 14 (ref 9–20)
BUN: 12 mg/dL (ref 6–20)
Bilirubin Total: 1.5 mg/dL — ABNORMAL HIGH (ref 0.0–1.2)
CO2: 23 mmol/L (ref 20–29)
Calcium: 9.3 mg/dL (ref 8.7–10.2)
Chloride: 102 mmol/L (ref 96–106)
Creatinine, Ser: 0.87 mg/dL (ref 0.76–1.27)
Globulin, Total: 2.4 g/dL (ref 1.5–4.5)
Glucose: 93 mg/dL (ref 70–99)
Potassium: 4.2 mmol/L (ref 3.5–5.2)
Sodium: 139 mmol/L (ref 134–144)
Total Protein: 7.1 g/dL (ref 6.0–8.5)
eGFR: 124 mL/min/{1.73_m2} (ref >59)

## 2022-11-29 LAB — INSULIN, RANDOM: INSULIN: 12.1 u[IU]/mL (ref 2.6–24.9)

## 2022-11-29 LAB — HEMOGLOBIN A1C
Est. average glucose Bld gHb Est-mCnc: 105 mg/dL
Hgb A1c MFr Bld: 5.3 % (ref 4.8–5.6)

## 2022-11-29 LAB — VITAMIN D 25 HYDROXY (VIT D DEFICIENCY, FRACTURES): Vit D, 25-Hydroxy: 25.6 ng/mL — ABNORMAL LOW (ref 30.0–100.0)

## 2022-12-06 DIAGNOSIS — M25572 Pain in left ankle and joints of left foot: Secondary | ICD-10-CM | POA: Diagnosis not present

## 2022-12-06 DIAGNOSIS — R262 Difficulty in walking, not elsewhere classified: Secondary | ICD-10-CM | POA: Diagnosis not present

## 2022-12-11 ENCOUNTER — Ambulatory Visit: Payer: BC Managed Care – PPO | Admitting: Bariatrics

## 2022-12-11 ENCOUNTER — Encounter: Payer: Self-pay | Admitting: Bariatrics

## 2022-12-11 VITALS — BP 140/80 | HR 67 | Temp 98.2°F | Ht 73.0 in | Wt 308.0 lb

## 2022-12-11 DIAGNOSIS — E559 Vitamin D deficiency, unspecified: Secondary | ICD-10-CM

## 2022-12-11 DIAGNOSIS — Z6841 Body Mass Index (BMI) 40.0 and over, adult: Secondary | ICD-10-CM

## 2022-12-11 DIAGNOSIS — E781 Pure hyperglyceridemia: Secondary | ICD-10-CM

## 2022-12-11 MED ORDER — VITAMIN D (ERGOCALCIFEROL) 1.25 MG (50000 UNIT) PO CAPS
50000.0000 [IU] | ORAL_CAPSULE | ORAL | 0 refills | Status: DC
Start: 2022-12-11 — End: 2022-12-26

## 2022-12-12 NOTE — Progress Notes (Unsigned)
Chief Complaint:   OBESITY Larry [name] is here to discuss his progress with his obesity treatment plan along with follow-up of his obesity related diagnoses. Larry [name] is on the Category 4 Plan and states he is following his eating plan approximately 95% of the time. [name] states he is doing 0 minutes 0 times per week.  Today's visit was #: 2 Starting weight: 320 lbs Starting date: 11/27/2022 Today's weight: 308 lbs Today's date: 12/11/2022 Total lbs lost to date: 12 Total lbs lost since last in-office visit: 12  Interim History: Larry [name] is down 12 pounds since his first initial visit.  He is forcing himself to eat.  He is getting adequate water.  Subjective:   1. Vitamin D insufficiency [name]'s recent vitamin D level was 25.6.  2. Hypertriglyceridemia Larry [name] has been working on decreasing carbohydrates in his diet.  Assessment/Plan:   1. Vitamin D insufficiency Larry [name] agreed to start prescription vitamin D 50,000 IU once weekly with no refills.  - Vitamin D, Ergocalciferol, (DRISDOL) 1.25 MG (50000 UNIT) CAPS capsule; Take 1 capsule (50,000 Units total) by mouth every 7 (seven) days.  Dispense: 5 capsule; Refill: 0  2. Hypertriglyceridemia [name] will work on cutting his carbohydrates.  Exercise was discussed with the patient (cardio and resistance).   3. Morbid obesity  4. BMI 40.0-44.9, adult Larry [name] is currently in the action stage of change. As such, his goal is to continue with weight loss efforts. He has agreed to the Category 4 Plan.   He will continue to adhere closely to the plan.  I review labs with the patient from 11/27/2022, CMP, lipid, vitamin D, A1c, insulin, and thyroid panel.  He is to eat protein first.  Eating out guide was provided.  Exercise goals: No exercise has been prescribed at this time.  Behavioral modification strategies: increasing lean protein intake, decreasing simple carbohydrates, increasing vegetables, increasing water intake,  decreasing eating out, no skipping meals, meal planning and cooking strategies, keeping healthy foods in the home, and planning for success.  Larry [name] has agreed to follow-up with our clinic in 2 weeks. He was informed of the importance of frequent follow-up visits to maximize his success with intensive lifestyle modifications for his multiple health conditions.   Objective:   Blood pressure (!) 140/80, pulse 67, temperature 98.2 F (36.8 C), height  (1.854 m), weight (!) 308 lb (139.7 kg), SpO2 99 %. Body mass index is 40.64 kg/m.  General: Cooperative, alert, well developed, in no acute distress. HEENT: Conjunctivae and lids unremarkable. Cardiovascular: Regular rhythm.  Lungs: Normal work of breathing. Neurologic: No focal deficits.   Lab Results  Component Value Date   CREATININE 0.87 11/27/2022   BUN 12 11/27/2022   NA 139 11/27/2022   K 4.2 11/27/2022   CL 102 11/27/2022   CO2 23 11/27/2022   Lab Results  Component Value Date   ALT 40 11/27/2022   AST 23 11/27/2022   ALKPHOS 96 11/27/2022   BILITOT 1.5 (H) 11/27/2022   Lab Results  Component Value Date   HGBA1C 5.3 11/27/2022   Lab Results  Component Value Date   INSULIN 12.1 11/27/2022   Lab Results  Component Value Date   TSH 1.820 11/27/2022   Lab Results  Component Value Date   CHOL 159 11/27/2022   HDL 44 11/27/2022   LDLCALC 76 11/27/2022   TRIG 238 (H) 11/27/2022   Lab Results  Component Value Date   VD25OH 25.6 (L) 11/27/2022   No results  found for: "WBC", "HGB", "HCT", "MCV", "PLT" No results found for: "IRON", "TIBC", "FERRITIN"  Attestation Statements:   Reviewed by clinician on day of visit: allergies, medications, problem list, medical history, surgical history, family history, social history, and previous encounter notes.   Trude Mcburney, am acting as Energy manager for [employer], DO.  I have reviewed the above documentation for accuracy and completeness, and I agree  with the above. Corinna Capra, DO

## 2022-12-13 ENCOUNTER — Encounter: Payer: Self-pay | Admitting: Bariatrics

## 2022-12-26 ENCOUNTER — Encounter: Payer: Self-pay | Admitting: Nurse Practitioner

## 2022-12-26 ENCOUNTER — Ambulatory Visit: Payer: BC Managed Care – PPO | Admitting: Nurse Practitioner

## 2022-12-26 VITALS — BP 125/85 | HR 62 | Temp 98.3°F | Ht 73.0 in | Wt 301.0 lb

## 2022-12-26 DIAGNOSIS — E559 Vitamin D deficiency, unspecified: Secondary | ICD-10-CM | POA: Diagnosis not present

## 2022-12-26 DIAGNOSIS — Z6839 Body mass index (BMI) 39.0-39.9, adult: Secondary | ICD-10-CM

## 2022-12-26 MED ORDER — VITAMIN D (ERGOCALCIFEROL) 1.25 MG (50000 UNIT) PO CAPS
50000.0000 [IU] | ORAL_CAPSULE | ORAL | 0 refills | Status: DC
Start: 2022-12-26 — End: 2023-01-25

## 2022-12-26 NOTE — Progress Notes (Signed)
Office: [phone]  /  Fax: [phone]  WEIGHT SUMMARY AND BIOMETRICS  Weight Lost Since Last Visit: 7lb  No data recorded  Vitals Temp: 98.3 F (36.8 C) BP: 125/85 Pulse Rate: 62 SpO2: 99 %   Anthropometric Measurements Height: 6\' 1"  (1.854 m) Weight: (!) 301 lb (136.5 kg) BMI (Calculated): 39.72 Weight at Last Visit: 308lb Weight Lost Since Last Visit: 7lb Starting Weight: 320lb Total Weight Loss (lbs): 9 lb (4.082 kg)   Body Composition  Body Fat %: 35.5 % Fat Mass (lbs): 107.2 lbs Muscle Mass (lbs): 185.2 lbs Total Body Water (lbs): 145 lbs Visceral Fat Rating : 17   Other Clinical Data Fasting: No Labs: Yes Today's Visit #: 3 Starting Date: 11/27/22     HPI  Chief Complaint: OBESITY  Larry [name] is here to discuss his progress with his obesity treatment plan. He is on the the Category 4 Plan and states he is following his eating plan approximately 90-95 % of the time. He states he is exercising 0 minutes 0 days per week.   Interval History:  Since last office visit he has lost 7 pounds. He is overall feeling better.  He is not eating out as much as he was in the past. He is eating more protein.  Denies hunger or cravings. He is drinking diet sodas 1-2 daily, seltzer water and water. He occ drinks beer-low carb.   His highest weight was 320 lbs.  He is seeing his surgeon on June 6th to discuss left ankle surgery.     Pharmacotherapy for weight loss: He is not currently taking medications  for medical weight loss.    Previous pharmacotherapy for medical weight loss:  none  Bariatric surgery:  Patient has not had bariatric surgery.    Vit D deficiency  He is taking Vit D 50,000 IU weekly.  Denies side effects.  Denies nausea, vomiting or muscle weakness.    Lab Results  Component Value Date   VD25OH 25.6 (L) 11/27/2022     PHYSICAL EXAM:  Blood pressure 125/85, pulse 62, temperature 98.3 F (36.8 C), height 6\' 1"  (1.854 m), weight (!) 301  lb (136.5 kg), SpO2 99 %. Body mass index is 39.71 kg/m.  General: He is overweight, cooperative, alert, well developed, and in no acute distress. PSYCH: Has normal mood, affect and thought process.   Extremities: No edema.  Neurologic: No gross sensory or motor deficits. No tremors or fasciculations noted.    DIAGNOSTIC DATA REVIEWED:  BMET    Component Value Date/Time   NA 139 11/27/2022 1108   K 4.2 11/27/2022 1108   CL 102 11/27/2022 1108   CO2 23 11/27/2022 1108   GLUCOSE 93 11/27/2022 1108   BUN 12 11/27/2022 1108   CREATININE 0.87 11/27/2022 1108   CALCIUM 9.3 11/27/2022 1108   Lab Results  Component Value Date   HGBA1C 5.3 11/27/2022   Lab Results  Component Value Date   INSULIN 12.1 11/27/2022   Lab Results  Component Value Date   TSH 1.820 11/27/2022   CBC No results found for: "WBC", "RBC", "HGB", "HCT", "PLT", "MCV", "MCH", "MCHC", "RDW" Iron Studies No results found for: "IRON", "TIBC", "FERRITIN", "IRONPCTSAT" Lipid Panel     Component Value Date/Time   CHOL 159 11/27/2022 1108   TRIG 238 (H) 11/27/2022 1108   HDL 44 11/27/2022 1108   LDLCALC 76 11/27/2022 1108   Hepatic Function Panel     Component Value Date/Time   PROT 7.1 11/27/2022 1108  ALBUMIN 4.7 11/27/2022 1108   AST 23 11/27/2022 1108   ALT 40 11/27/2022 1108   ALKPHOS 96 11/27/2022 1108   BILITOT 1.5 (H) 11/27/2022 1108      Component Value Date/Time   TSH 1.820 11/27/2022 1108   Nutritional Lab Results  Component Value Date   VD25OH 25.6 (L) 11/27/2022     ASSESSMENT AND PLAN  TREATMENT PLAN FOR OBESITY:  Recommended Dietary Goals  Larry [name] is currently in the action stage of change. As such, his goal is to continue weight management plan. He has agreed to the Category 4 Plan.  Behavioral Intervention  We discussed the following Behavioral Modification Strategies today: increasing lean protein intake, decreasing simple carbohydrates , increasing vegetables,  increasing lower glycemic fruits, increasing fiber rich foods, avoiding skipping meals, increasing water intake, continue to practice mindfulness when eating, and planning for success.  Additional resources provided today: NA  Recommended Physical Activity Goals  Larry [name] has been advised to work up to 150 minutes of moderate intensity aerobic activity a week and strengthening exercises 2-3 times per week for cardiovascular health, weight loss maintenance and preservation of muscle mass.   He has agreed to Continue current level of physical activity  and Think about ways to increase physical activity   ASSOCIATED CONDITIONS ADDRESSED TODAY  Action/Plan  Vitamin D insufficiency -     Vitamin D (Ergocalciferol); Take 1 capsule (50,000 Units total) by mouth every 7 (seven) days.  Dispense: 5 capsule; Refill: 0. Side effects discussed.   Low Vitamin D level contributes to fatigue and are associated with obesity, breast, and colon cancer. He agrees to continue to take prescription Vitamin D @50 ,000 IU every week and will follow-up for routine testing of Vitamin D, at least 2-3 times per year to avoid over-replacement.   Morbid obesity (HCC)  BMI 39.0-39.9,adult         Return in about 2 weeks (around 01/09/2023).Marland Kitchen He was informed of the importance of frequent follow up visits to maximize his success with intensive lifestyle modifications for his multiple health conditions.   ATTESTASTION STATEMENTS:  Reviewed by clinician on day of visit: allergies, medications, problem list, medical history, surgical history, family history, social history, and previous encounter notes.     Theodis Sato. Ahrianna Siglin FNP-C

## 2023-01-09 ENCOUNTER — Encounter: Payer: Self-pay | Admitting: Bariatrics

## 2023-01-09 ENCOUNTER — Ambulatory Visit: Payer: BC Managed Care – PPO | Admitting: Bariatrics

## 2023-01-09 VITALS — BP 127/77 | HR 52 | Temp 97.8°F | Ht 73.0 in | Wt 302.0 lb

## 2023-01-09 DIAGNOSIS — E781 Pure hyperglyceridemia: Secondary | ICD-10-CM

## 2023-01-09 DIAGNOSIS — E669 Obesity, unspecified: Secondary | ICD-10-CM | POA: Diagnosis not present

## 2023-01-09 DIAGNOSIS — Z6839 Body mass index (BMI) 39.0-39.9, adult: Secondary | ICD-10-CM | POA: Diagnosis not present

## 2023-01-10 NOTE — Progress Notes (Unsigned)
     Chief Complaint:   OBESITY Larry [name] is here to discuss his progress with his obesity treatment plan along with follow-up of his obesity related diagnoses. Larry [name] is on the Category 4 Plan and states he is following his eating plan approximately 60% of the time. 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  Today's visit was #: 4 Starting weight: 320 lbs Starting date: 11/27/2022 Today's weight: 302 lbs Today's date: 01/09/2023 Total lbs lost to date: 18 Total lbs lost since last in-office visit: 0  Interim History: Larry [name] is up 1 lb since his last visit. He has been camping out and celebrating. He is getting his protein and water in. He has not been hungry most days.   Subjective:   1. Hypertriglyceridemia Larry [name] is not on medications.   Assessment/Plan:   1. Hypertriglyceridemia Larry [name] will continue to minimize all carbohydrates and increase his exercise.   2. Generalized obesity  3. BMI 39.0-39.9,adult Larry [name] is currently in the action stage of change. As such, his goal is to continue with weight loss efforts. He has agreed to the Category 4 Plan.   Meal planning and intentional eating were discussed.   Exercise goals: Will resume activity. He  will see his surgeon in June.   Behavioral modification strategies: increasing lean protein intake, decreasing simple carbohydrates, increasing vegetables, increasing water intake, decreasing eating out, no skipping meals, meal planning and cooking strategies, keeping healthy foods in the home, and planning for success.  Larry [name] has agreed to follow-up with our clinic in 2 weeks. He was informed of the importance of frequent follow-up visits to maximize his success with intensive lifestyle modifications for his multiple health conditions.   Objective:   Blood pressure 127/77, pulse (!) 52, temperature 97.8 F (36.6 C), height 6\' 1"  (1.854 m), weight (!) 302 lb (137 kg), SpO2 99 %. Body mass index  is 39.84 kg/m.  General: Cooperative, alert, well developed, in no acute distress. HEENT: Conjunctivae and lids unremarkable. Cardiovascular: Regular rhythm.  Lungs: Normal work of breathing. Neurologic: No focal deficits.   Lab Results  Component Value Date   CREATININE 0.87 11/27/2022   BUN 12 11/27/2022   NA 139 11/27/2022   K 4.2 11/27/2022   CL 102 11/27/2022   CO2 23 11/27/2022   Lab Results  Component Value Date   ALT 40 11/27/2022   AST 23 11/27/2022   ALKPHOS 96 11/27/2022   BILITOT 1.5 (H) 11/27/2022   Lab Results  Component Value Date   HGBA1C 5.3 11/27/2022   Lab Results  Component Value Date   INSULIN 12.1 11/27/2022   Lab Results  Component Value Date   TSH 1.820 11/27/2022   Lab Results  Component Value Date   CHOL 159 11/27/2022   HDL 44 11/27/2022   LDLCALC 76 11/27/2022   TRIG 238 (H) 11/27/2022   Lab Results  Component Value Date   VD25OH 25.6 (L) 11/27/2022   No results found for: "WBC", "HGB", "HCT", "MCV", "PLT" No results found for: "IRON", "TIBC", "FERRITIN"  Attestation Statements:   Reviewed by clinician on day of visit: allergies, medications, problem list, medical history, surgical history, family history, social history, and previous encounter notes.   Trude Mcburney, am acting as Energy manager for [employer], DO.  I have reviewed the above documentation for accuracy and completeness, and I agree with the above. Corinna Capra, DO

## 2023-01-11 ENCOUNTER — Encounter: Payer: Self-pay | Admitting: Bariatrics

## 2023-01-12 IMAGING — CR DG ORBITS FOR FOREIGN BODY
2 series · 2 of 2 positions shown · non-contrast
Comparison: None Available.

CLINICAL DATA: Metal working/exposure; clearance prior to MRI

EXAM:
ORBITS FOR FOREIGN BODY - 2 VIEW

[w orbit pa (1 of 2)]
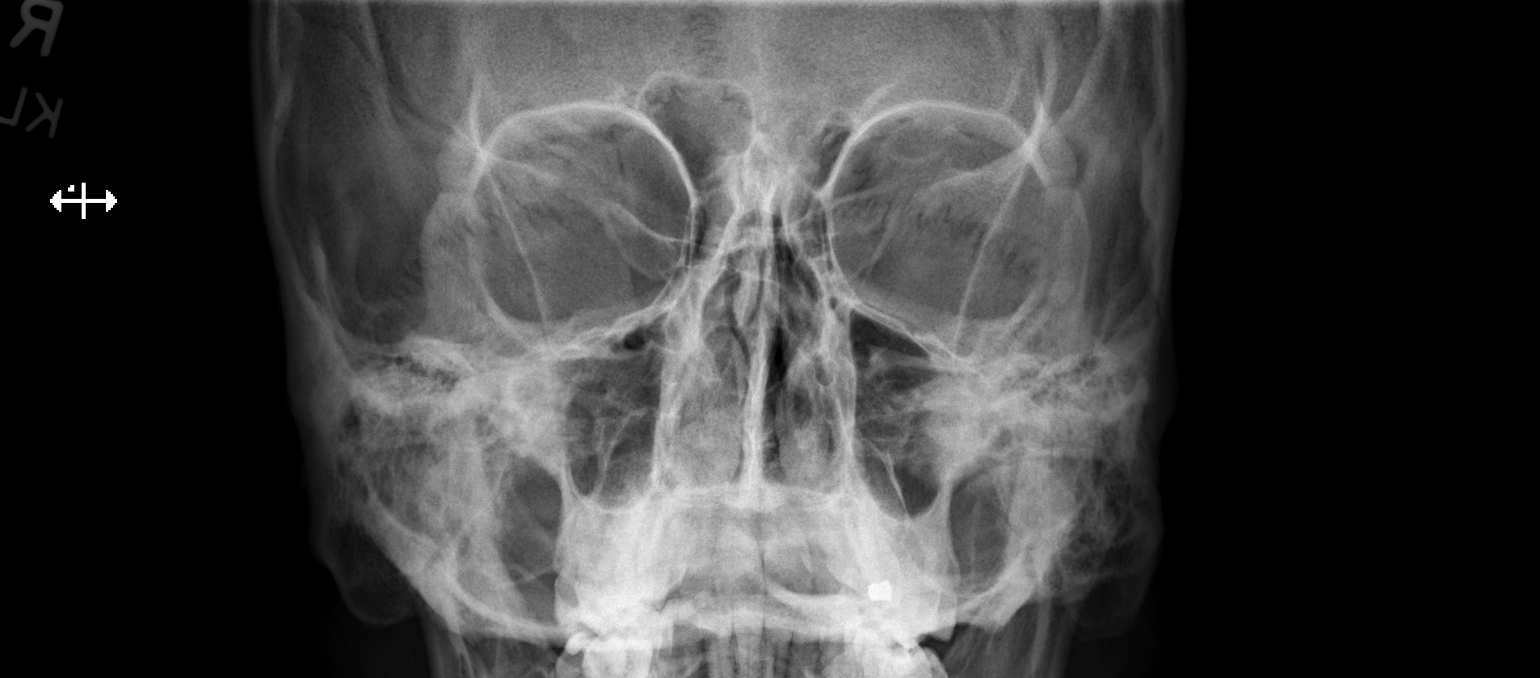

[w orbit pa (2 of 2)]
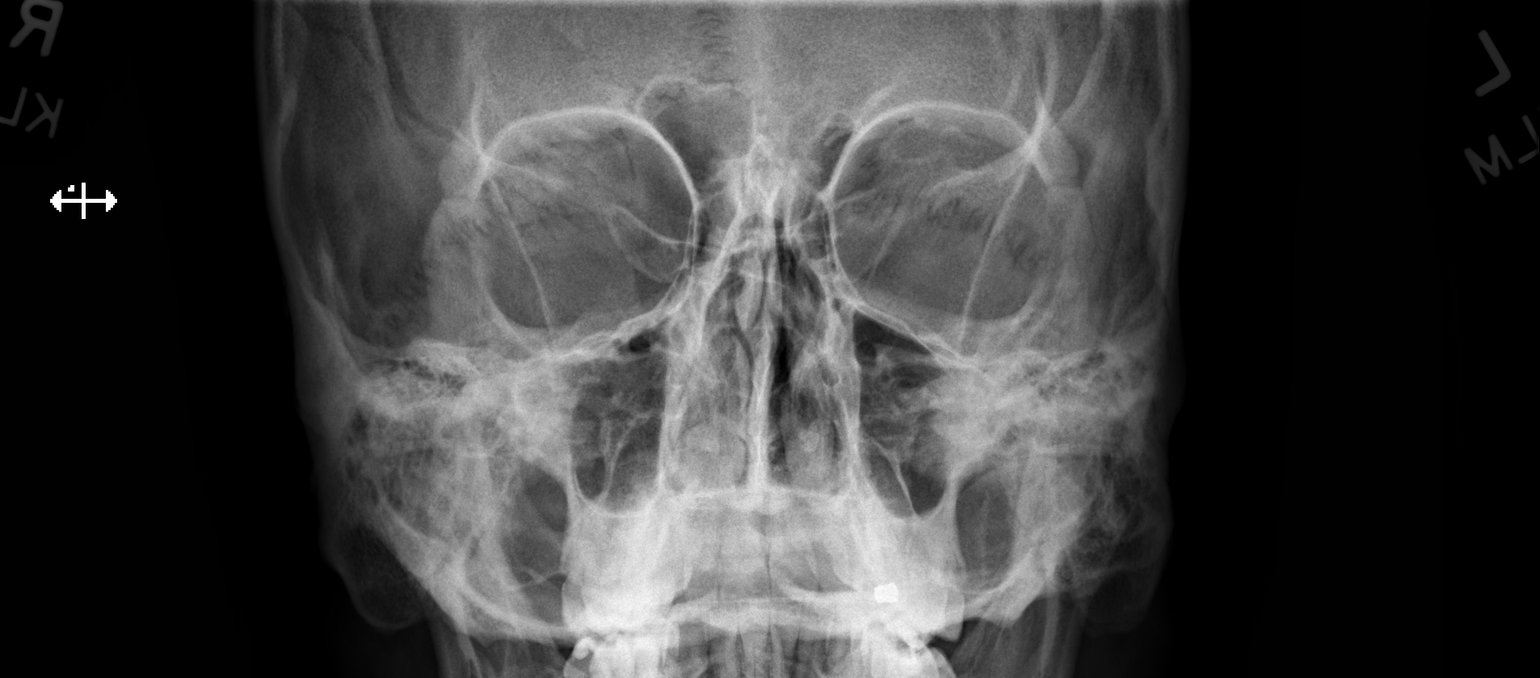

[2 of 2 positions shown; findings below may reference images not displayed]

FINDINGS: Limited evaluation due to overlapping osseous structures and
overlying soft tissues. There is no evidence of metallic foreign
body within the orbits. Dental hardware. No significant bone
abnormality identified.
IMPRESSION: No evidence of metallic foreign body within the orbits.

## 2023-01-25 ENCOUNTER — Encounter: Payer: Self-pay | Admitting: Bariatrics

## 2023-01-25 ENCOUNTER — Ambulatory Visit: Payer: BC Managed Care – PPO | Admitting: Bariatrics

## 2023-01-25 VITALS — BP 131/74 | HR 61 | Temp 97.8°F | Ht 73.0 in | Wt 294.0 lb

## 2023-01-25 DIAGNOSIS — E669 Obesity, unspecified: Secondary | ICD-10-CM

## 2023-01-25 DIAGNOSIS — Z6838 Body mass index (BMI) 38.0-38.9, adult: Secondary | ICD-10-CM

## 2023-01-25 DIAGNOSIS — E559 Vitamin D deficiency, unspecified: Secondary | ICD-10-CM

## 2023-01-25 DIAGNOSIS — E781 Pure hyperglyceridemia: Secondary | ICD-10-CM | POA: Diagnosis not present

## 2023-01-25 MED ORDER — VITAMIN D (ERGOCALCIFEROL) 1.25 MG (50000 UNIT) PO CAPS
50000.0000 [IU] | ORAL_CAPSULE | ORAL | 0 refills | Status: DC
Start: 2023-01-25 — End: 2023-02-05

## 2023-01-25 NOTE — Progress Notes (Signed)
WEIGHT SUMMARY AND BIOMETRICS  Weight Lost Since Last Visit: 8lb   Vitals Temp: 97.8 F (36.6 C) BP: 131/74 Pulse Rate: 61 SpO2: 99 %   Anthropometric Measurements Height: 6\' 1"  (1.854 m) Weight: 294 lb (133.4 kg) BMI (Calculated): 38.8 Weight at Last Visit: 302lb Weight Lost Since Last Visit: 8lb Starting Weight: 320lb Total Weight Loss (lbs): 26 lb (11.8 kg)   Body Composition  Body Fat %: 32.5 % Fat Mass (lbs): 95.6 lbs Muscle Mass (lbs): 189 lbs Total Body Water (lbs): 140.8 lbs Visceral Fat Rating : 15   Other Clinical Data Fasting: no Labs: no Today's Visit #: 5 Starting Date: 11/27/22    OBESITY Larry [name] is here to discuss his progress with his obesity treatment plan along with follow-up of his obesity related diagnoses.     Nutrition Plan: the Category 4 plan - 75-80% adherence.  Current exercise: walking  Interim History:  He is down another 8 lbs since his last visit and doing very well overall.  Eating all of the food on the plan., Protein intake is as prescribed, Is not skipping meals, Water intake is adequate., Denies polyphagia, and Denies excessive cravings.  Hunger is moderately controlled.  Cravings are well controlled.  Assessment/Plan:   1. Vitamin D insufficiency Vitamin D Deficiency Vitamin D is not at goal of 50.  Most recent vitamin D level was 25.6. He is on  prescription ergocalciferol 50,000 IU weekly. Lab Results  Component Value Date   VD25OH 25.6 (L) 11/27/2022    Plan: Refill prescription vitamin D 50,000 IU weekly.   2. Hypertriglyceridemia  Triglycerides are high at 238 Medication(s): None Cardiovascular risk factors: male gender and obesity (BMI >= 30 kg/m2)  Lab Results  Component Value Date   CHOL 159 11/27/2022   HDL 44 11/27/2022   LDLCALC 76 11/27/2022   TRIG 238 (H) 11/27/2022   Lab Results  Component Value Date   ALT 40 11/27/2022   AST 23 11/27/2022   ALKPHOS 96 11/27/2022    BILITOT 1.5 (H) 11/27/2022   The ASCVD Risk score (Arnett DK, et al., 2019) failed to calculate for the following reasons:   The 2019 ASCVD risk score is only valid for ages 55 to 1  Plan:  Information sheet on healthy vs unhealthy fats.  Will avoid all trans fats.  Will read labels Will minimize saturated fats except the following: low fat meats in moderation, diary, and limited dark chocolate.  Increase Omega 3 in foods, and consider an Omega 3 supplement.       Generalized Obesity: Current BMI BMI (Calculated): 38.8    Larry [name] is currently in the action stage of change. As such, his goal is to continue with weight loss efforts.  He has agreed to the Category 4 plan.  Exercise goals: For substantial health benefits, adults should do at least 150 minutes (2 hours and 30 minutes) a week of moderate-intensity, or 75 minutes (1 hour and 15 minutes) a week of vigorous-intensity aerobic physical activity, or an equivalent combination of moderate- and vigorous-intensity aerobic activity. Aerobic activity should be performed in episodes of at least 10 minutes, and preferably, it should be spread throughout the week.  Behavioral modification strategies: decreasing simple carbohydrates , no meal skipping, increase water intake, better snacking choices, and mindful eating.  Larry [name] has agreed to follow-up with our clinic in 4 weeks.       Objective:   VITALS: Per patient if applicable, see vitals. GENERAL: Alert and in  no acute distress. CARDIOPULMONARY: No increased WOB. Speaking in clear sentences.  PSYCH: Pleasant and cooperative. Speech normal rate and rhythm. Affect is appropriate. Insight and judgement are appropriate. Attention is focused, linear, and appropriate.  NEURO: Oriented as arrived to appointment on time with no prompting.   Attestation Statements:    This was prepared with the assistance of Engineer, civil (consulting).  Occasional wrong-word or sound-a-like substitutions  may have occurred due to the inherent limitations of voice recognition software.  Corinna Capra, DO

## 2023-01-31 ENCOUNTER — Ambulatory Visit: Payer: BC Managed Care – PPO | Admitting: Podiatry

## 2023-01-31 ENCOUNTER — Ambulatory Visit (INDEPENDENT_AMBULATORY_CARE_PROVIDER_SITE_OTHER): Payer: BC Managed Care – PPO

## 2023-01-31 DIAGNOSIS — S82852K Displaced trimalleolar fracture of left lower leg, subsequent encounter for closed fracture with nonunion: Secondary | ICD-10-CM | POA: Diagnosis not present

## 2023-01-31 DIAGNOSIS — T8484XA Pain due to internal orthopedic prosthetic devices, implants and grafts, initial encounter: Secondary | ICD-10-CM | POA: Diagnosis not present

## 2023-01-31 DIAGNOSIS — M25879 Other specified joint disorders, unspecified ankle and foot: Secondary | ICD-10-CM | POA: Diagnosis not present

## 2023-01-31 NOTE — Progress Notes (Signed)
  Subjective:  Patient ID: Larry [name], male    DOB: 27-Mar-2001,  MRN: [id number]  Chief Complaint  Patient presents with   Routine Post Op    "It's pretty good."      22 y.o. male returns for post-op check.  Overall doing okay.  Does still have pain in the front of the ankle and he has been on it for some time or with cold rainy weather.  Most of the pain is still on the medial ankle where the plate is.  Review of Systems: Negative except as noted in the HPI. Denies N/V/F/Ch.   Objective:   Vitals:   10/30/22 1355  BP: 128/75  Pulse: 73   There is no height or weight on file to calculate BMI. Constitutional Well developed. Well nourished.  Vascular Foot warm and well perfused. Capillary refill normal to all digits.  Calf is soft and supple, no posterior calf or knee pain, negative Homans' sign  Neurologic Normal speech. Oriented to person, place, and time. Epicritic sensation to light touch grossly present bilaterally.  Dermatologic Incisions well-healed nonhypertrophic  Orthopedic: Only mild edema.  Good range of motion of joint.  No pain on palpation of the anterior joint or with range of motion.  He did have pain to palpation over the medial plate.  None of the lateral plate   Multiple view plain film radiographs: Fracture repair sites appear to be well-healed.  Prominence of plate on the medial malleolus, unchanged alignment and quality of ankle joint, overall alignment of the mortise is maintained Assessment:   1. 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  S/p foot surgery left -Overall doing well.  He is lost over 26 pounds, was also found to be in vitamin D deficiency.  The platelets continue to be painful on the medial side.  We discussed removal of the implant and arthroscopy of the ankle with debridement of any impinging synovitis or scar tissue.  He  would like to proceed with this.  At we discussed the risk benefits and potential complications of this.  All questions addressed.  Informed consent signed and reviewed.   Surgical plan:  Procedure: -Left ankle medial plate removal, ankle arthroscopy  Location: -[facility]  Anesthesia plan: -IV sedation with regional block  Postoperative pain plan: - Tylenol 1000 mg every 6 hours, ibuprofen 600 mg every 6 hours, gabapentin 300 mg every 8 hours x5 days, oxycodone 5 mg 1-2 tabs every 6 hours only as needed  DVT prophylaxis: -None required  WB Restrictions / DME needs: -WBAT in CAM boot postop   Return in about 3 months (around 01/30/2023) for follow up ankle surgery.

## 2023-02-02 ENCOUNTER — Other Ambulatory Visit: Payer: Self-pay | Admitting: Nurse Practitioner

## 2023-02-02 DIAGNOSIS — E559 Vitamin D deficiency, unspecified: Secondary | ICD-10-CM

## 2023-02-19 ENCOUNTER — Ambulatory Visit: Payer: BC Managed Care – PPO | Admitting: Nurse Practitioner

## 2023-02-19 ENCOUNTER — Encounter: Payer: Self-pay | Admitting: Nurse Practitioner

## 2023-02-19 VITALS — BP 124/83 | HR 60 | Temp 97.9°F | Ht 73.0 in | Wt 296.0 lb

## 2023-02-19 DIAGNOSIS — E559 Vitamin D deficiency, unspecified: Secondary | ICD-10-CM | POA: Diagnosis not present

## 2023-02-19 DIAGNOSIS — Z6839 Body mass index (BMI) 39.0-39.9, adult: Secondary | ICD-10-CM

## 2023-02-19 MED ORDER — VITAMIN D (ERGOCALCIFEROL) 1.25 MG (50000 UNIT) PO CAPS: 50000.0000 [IU] | ORAL_CAPSULE | ORAL | 0 refills | Status: DC

## 2023-02-19 NOTE — Progress Notes (Signed)
Office: [phone]  /  Fax: [phone]  WEIGHT SUMMARY AND BIOMETRICS  No data recorded Weight Gained Since Last Visit: 2lb   Vitals Temp: 97.9 F (36.6 C) BP: 124/83 Pulse Rate: 60 SpO2: 99 %   Anthropometric Measurements Height: 6\' 1"  (1.854 m) Weight: 296 lb (134.3 kg) BMI (Calculated): 39.06 Weight at Last Visit: 294lb Weight Gained Since Last Visit: 2lb Starting Weight: 320lb Total Weight Loss (lbs): 24 lb (10.9 kg)   Body Composition  Body Fat %: 32.1 % Fat Mass (lbs): 95 lbs Muscle Mass (lbs): 191.4 lbs Total Body Water (lbs): 146.4 lbs Visceral Fat Rating : 14   Other Clinical Data Fasting: No Labs: No Today's Visit #: 6 Starting Date: 11/27/22     HPI  Chief Complaint: OBESITY  Bret is here to discuss his progress with his obesity treatment plan. He is on the the Category 4 Plan and states he is following his eating plan approximately 80 % of the time. He states he is exercising 60 minutes 3 days per week.   Interval History:  Since last office visit he has gained 2 pounds. He is not meeting calorie goals.  Is meeting protein goals.  He is drinking a protein shake (1-2 daily) and water. He biggest problem is drinking beer/seltzer/whiskey on the weekends.  He is scheduled for surgery August 9th.    Pharmacotherapy for weight loss: He is not currently taking medications  for medical weight loss.     Previous pharmacotherapy for medical weight loss:  None  Bariatric surgery:  Patient has not bariatric surgery.   Vit D deficiency  He is taking Vit D 50,000 IU weekly.  Denies side effects.  Denies nausea, vomiting or muscle weakness.    Lab Results  Component Value Date   VD25OH 25.6 (L) 11/27/2022       PHYSICAL EXAM:  Blood pressure 124/83, pulse 60, temperature 97.9 F (36.6 C), height 6\' 1"  (1.854 m), weight 296 lb (134.3 kg), SpO2 99 %. Body mass index is 39.05 kg/m.  General: He is overweight, cooperative, alert, well  developed, and in no acute distress. PSYCH: Has normal mood, affect and thought process.   Extremities: No edema.  Neurologic: No gross sensory or motor deficits. No tremors or fasciculations noted.    DIAGNOSTIC DATA REVIEWED:  BMET    Component Value Date/Time   NA 139 11/27/2022 1108   K 4.2 11/27/2022 1108   CL 102 11/27/2022 1108   CO2 23 11/27/2022 1108   GLUCOSE 93 11/27/2022 1108   BUN 12 11/27/2022 1108   CREATININE 0.87 11/27/2022 1108   CALCIUM 9.3 11/27/2022 1108   Lab Results  Component Value Date   HGBA1C 5.3 11/27/2022   Lab Results  Component Value Date   INSULIN 12.1 11/27/2022   Lab Results  Component Value Date   TSH 1.820 11/27/2022   CBC No results found for: "WBC", "RBC", "HGB", "HCT", "PLT", "MCV", "MCH", "MCHC", "RDW" Iron Studies No results found for: "IRON", "TIBC", "FERRITIN", "IRONPCTSAT" Lipid Panel     Component Value Date/Time   CHOL 159 11/27/2022 1108   TRIG 238 (H) 11/27/2022 1108   HDL 44 11/27/2022 1108   LDLCALC 76 11/27/2022 1108   Hepatic Function Panel     Component Value Date/Time   PROT 7.1 11/27/2022 1108   ALBUMIN 4.7 11/27/2022 1108   AST 23 11/27/2022 1108   ALT 40 11/27/2022 1108   ALKPHOS 96 11/27/2022 1108   BILITOT 1.5 (H) 11/27/2022  1108      Component Value Date/Time   TSH 1.820 11/27/2022 1108   Nutritional Lab Results  Component Value Date   VD25OH 25.6 (L) 11/27/2022     ASSESSMENT AND PLAN  TREATMENT PLAN FOR OBESITY:  Recommended Dietary Goals  Larry [name] is currently in the action stage of change. As such, his goal is to continue weight management plan. He has agreed to the Category 4 Plan.  Had a long discussion today about reducing alcohol intake and safety with drinking and driving and health implications associated with alcohol intake.  Patient verbalizes understanding.  We also discussed the importance of not limiting calories-aim for 1800-2200 calories and 100+ grams of protein.     Behavioral Intervention  We discussed the following Behavioral Modification Strategies today: increasing lean protein intake, decreasing simple carbohydrates , increasing vegetables, increasing lower glycemic fruits, increasing fiber rich foods, avoiding skipping meals, increasing water intake, continue to practice mindfulness when eating, and planning for success.  Additional resources provided today: NA  Recommended Physical Activity Goals  Gardiner has been advised to work up to 150 minutes of moderate intensity aerobic activity a week and strengthening exercises 2-3 times per week for cardiovascular health, weight loss maintenance and preservation of muscle mass.   He has agreed to Continue current level of physical activity     ASSOCIATED CONDITIONS ADDRESSED TODAY  Action/Plan  Vitamin D insufficiency -     Vitamin D (Ergocalciferol); Take 1 capsule (50,000 Units total) by mouth every 7 (seven) days.  Dispense: 5 capsule; Refill: 0.  Side effects discussed.    Low Vitamin D level contributes to fatigue and are associated with obesity, breast, and colon cancer. He agrees to continue to take prescription Vitamin D @50 ,000 IU every week and will follow-up for routine testing of Vitamin D, at least 2-3 times per year to avoid over-replacement.   Morbid obesity (HCC)  BMI 39.0-39.9,adult         Return in about 3 weeks (around 03/12/2023).Marland Kitchen He was informed of the importance of frequent follow up visits to maximize his success with intensive lifestyle modifications for his multiple health conditions.   ATTESTASTION STATEMENTS:  Reviewed by clinician on day of visit: allergies, medications, problem list, medical history, surgical history, family history, social history, and previous encounter notes.    Theodis Sato. Denette Hass FNP-C

## 2023-02-20 ENCOUNTER — Ambulatory Visit: Payer: BC Managed Care – PPO | Admitting: Nurse Practitioner

## 2023-02-23 ENCOUNTER — Telehealth: Payer: Self-pay | Admitting: Urology

## 2023-02-23 NOTE — Telephone Encounter (Signed)
DOS - 04/06/23  REMOVAL HARDWARE LEFT --- [id number] ANKLE ARTHROSCOPY LEFT --- [id number]  [employer] EFFECTIVE DATE - 11/27/22  DEDUCTIBLE - $500.00 W/ $378.12 REMAINING OOP - $6,350.00 W/ $6,090.14 REMAINING COINSURANCE - 20%  SPOKE WITH ANA T. WITH [employer] AND SHE STATED THAT FOR CPT CODES 81191 AND [phone] NO PRIOR AUTH IS REQUIRED.   CALL REF # ANA T. 02/23/23 AT 9:37 AM EST

## 2023-03-12 ENCOUNTER — Encounter: Payer: Self-pay | Admitting: Podiatry

## 2023-03-13 ENCOUNTER — Ambulatory Visit: Payer: BC Managed Care – PPO | Admitting: Bariatrics

## 2023-03-13 ENCOUNTER — Encounter: Payer: Self-pay | Admitting: Bariatrics

## 2023-03-13 VITALS — BP 132/81 | HR 50 | Temp 97.8°F | Ht 73.0 in | Wt 288.0 lb

## 2023-03-13 DIAGNOSIS — E559 Vitamin D deficiency, unspecified: Secondary | ICD-10-CM

## 2023-03-13 DIAGNOSIS — E781 Pure hyperglyceridemia: Secondary | ICD-10-CM | POA: Diagnosis not present

## 2023-03-13 DIAGNOSIS — E669 Obesity, unspecified: Secondary | ICD-10-CM | POA: Diagnosis not present

## 2023-03-13 DIAGNOSIS — Z6838 Body mass index (BMI) 38.0-38.9, adult: Secondary | ICD-10-CM

## 2023-03-13 MED ORDER — VITAMIN D (ERGOCALCIFEROL) 1.25 MG (50000 UNIT) PO CAPS: 50000.0000 [IU] | ORAL_CAPSULE | ORAL | 0 refills | Status: DC

## 2023-03-13 NOTE — Progress Notes (Signed)
DOS 06/07/2022  Left ankle fracture revision with bone graft from heel

## 2023-03-13 NOTE — Progress Notes (Signed)
WEIGHT SUMMARY AND BIOMETRICS  Weight Lost Since Last Visit: 8lb   Vitals Temp: 97.8 F (36.6 C) BP: 132/81 Pulse Rate: (!) 50 SpO2: 100 %   Anthropometric Measurements Height: 6\' 1"  (1.854 m) Weight: 288 lb (130.6 kg) BMI (Calculated): 38.01 Weight at Last Visit: 296lb Weight Lost Since Last Visit: 8lb Starting Weight: 320lb Total Weight Loss (lbs): 32 lb (14.5 kg)   Body Composition  Body Fat %: 33.7 % Fat Mass (lbs): 97.4 lbs Muscle Mass (lbs): 182 lbs Total Body Water (lbs): 139.6 lbs Visceral Fat Rating : 15   Other Clinical Data Fasting: no Labs: no Today's Visit #: 7 Starting Date: 11/27/22    OBESITY Larry [name] is here to discuss his progress with his obesity treatment plan along with follow-up of his obesity related diagnoses.     Nutrition Plan: the Category 4 plan - 80% adherence.  Current exercise: none  Interim History:  He is down 8 lbs since his last visit. He continues to drink an extreme amount of beer. He has not been exercising as much and has lost some muscle per the bio-impedence machine.  Eating all of the food on the plan., Meeting calorie goals., Water intake is adequate., Denies polyphagia, and Denies excessive cravings.  Hunger is moderately controlled.  Cravings are moderately controlled.  Assessment/Plan:   1. Vitamin D insufficiency Vitamin D Deficiency Vitamin D is not at goal of 50.  Most recent vitamin D level was 25.6. He is on  prescription ergocalciferol 50,000 IU weekly. Lab Results  Component Value Date   VD25OH 25.6 (L) 11/27/2022    Plan: Refill prescription vitamin D 50,000 IU weekly.   High Triglycerides:  Triglycerides are not at goal. Medication(s): none Cardiovascular risk factors: male gender and obesity (BMI >= 30 kg/m2)  Lab Results  Component Value Date   CHOL 159 11/27/2022   HDL 44 11/27/2022   LDLCALC 76 11/27/2022   TRIG 238 (H) 11/27/2022   Lab Results  Component Value Date    ALT 40 11/27/2022   AST 23 11/27/2022   ALKPHOS 96 11/27/2022   BILITOT 1.5 (H) 11/27/2022   The ASCVD Risk score (Arnett DK, et al., 2019) failed to calculate for the following reasons:   The 2019 ASCVD risk score is only valid for ages 31 to 18  Plan:  Information sheet on healthy vs unhealthy fats.  Will avoid all trans fats.  Will read labels Will continue to cut back on his beer consumption.    Generalized Obesity: Current BMI BMI (Calculated): 38.01    Larry [name] is currently in the action stage of change. As such, his goal is to continue with weight loss efforts.  He has agreed to keeping a food journal with goal of 1,800 to 2,000 calories and 100 +  grams of protein daily.  Exercise goals: All adults should avoid inactivity. Some physical activity is better than none, and adults who participate in any amount of physical activity gain some health benefits.  He has a planned surgery for his ankle on 04/06/2023 for hardware removal, and arthroscopy.   Behavioral modification strategies: increase water intake, planning for success, keep healthy foods in the home, and mindful eating.  Larry [name] has agreed to follow-up with our clinic in 4 weeks.     Objective:   VITALS: Per patient if applicable, see vitals. GENERAL: Alert and in no acute distress. CARDIOPULMONARY: No increased WOB. Speaking in clear sentences.  PSYCH: Pleasant and cooperative. Speech normal rate and  rhythm. Affect is appropriate. Insight and judgement are appropriate. Attention is focused, linear, and appropriate.  NEURO: Oriented as arrived to appointment on time with no prompting.   Attestation Statements:    This was prepared with the assistance of Engineer, civil (consulting).  Occasional wrong-word or sound-a-like substitutions may have occurred due to the inherent limitations of voice recognition software.   Corinna Capra, DO

## 2023-04-04 ENCOUNTER — Encounter: Payer: Self-pay | Admitting: Podiatry

## 2023-04-06 ENCOUNTER — Other Ambulatory Visit: Payer: Self-pay | Admitting: Podiatry

## 2023-04-06 DIAGNOSIS — M25872 Other specified joint disorders, left ankle and foot: Secondary | ICD-10-CM | POA: Diagnosis not present

## 2023-04-06 DIAGNOSIS — M12572 Traumatic arthropathy, left ankle and foot: Secondary | ICD-10-CM | POA: Diagnosis not present

## 2023-04-06 DIAGNOSIS — T8484XA Pain due to internal orthopedic prosthetic devices, implants and grafts, initial encounter: Secondary | ICD-10-CM | POA: Diagnosis not present

## 2023-04-06 DIAGNOSIS — S82852K Displaced trimalleolar fracture of left lower leg, subsequent encounter for closed fracture with nonunion: Secondary | ICD-10-CM | POA: Diagnosis not present

## 2023-04-06 DIAGNOSIS — M659 Synovitis and tenosynovitis, unspecified: Secondary | ICD-10-CM | POA: Diagnosis not present

## 2023-04-06 DIAGNOSIS — G8918 Other acute postprocedural pain: Secondary | ICD-10-CM | POA: Diagnosis not present

## 2023-04-06 MED ORDER — ACETAMINOPHEN 500 MG PO TABS: 1000.0000 mg | ORAL_TABLET | Freq: Four times a day (QID) | ORAL | 0 refills | Status: AC | PRN

## 2023-04-06 MED ORDER — GABAPENTIN 300 MG PO CAPS: 300.0000 mg | ORAL_CAPSULE | Freq: Three times a day (TID) | ORAL | 0 refills | Status: AC

## 2023-04-06 MED ORDER — IBUPROFEN 600 MG PO TABS: 600.0000 mg | ORAL_TABLET | Freq: Four times a day (QID) | ORAL | 0 refills | Status: AC | PRN

## 2023-04-06 MED ORDER — OXYCODONE HCL 5 MG PO TABS: 5.0000 mg | ORAL_TABLET | ORAL | 0 refills | Status: AC | PRN

## 2023-04-06 NOTE — Progress Notes (Signed)
04/06/23 ankle arthroscopy, painful hardware removal

## 2023-04-07 ENCOUNTER — Encounter: Payer: Self-pay | Admitting: Podiatry

## 2023-04-07 MED ORDER — DOXYCYCLINE HYCLATE 100 MG PO TABS: 100.0000 mg | ORAL_TABLET | Freq: Two times a day (BID) | ORAL | 0 refills | Status: DC

## 2023-04-11 ENCOUNTER — Ambulatory Visit (INDEPENDENT_AMBULATORY_CARE_PROVIDER_SITE_OTHER): Payer: BC Managed Care – PPO | Admitting: Podiatry

## 2023-04-11 ENCOUNTER — Encounter: Payer: Self-pay | Admitting: Podiatry

## 2023-04-11 ENCOUNTER — Ambulatory Visit (INDEPENDENT_AMBULATORY_CARE_PROVIDER_SITE_OTHER): Payer: BC Managed Care – PPO

## 2023-04-11 DIAGNOSIS — Z9889 Other specified postprocedural states: Secondary | ICD-10-CM

## 2023-04-11 DIAGNOSIS — M25879 Other specified joint disorders, unspecified ankle and foot: Secondary | ICD-10-CM

## 2023-04-11 DIAGNOSIS — T8484XA Pain due to internal orthopedic prosthetic devices, implants and grafts, initial encounter: Secondary | ICD-10-CM

## 2023-04-11 NOTE — Progress Notes (Signed)
  Subjective:  Patient ID: Atwood Wardrop, male    DOB: 2001-06-19,  MRN: [id number]  Chief Complaint  Patient presents with   Routine Post Op    "It's doing pretty good, nothing I can notice wrong."    DOS: 04/06/2023 Procedure: Removal of medial plate, ankle arthroscopy, PRP injection  22 y.o. male returns for post-op check.  Overall he is doing fairly well  Review of Systems: Negative except as noted in the HPI. Denies N/V/F/Ch.   Objective:  There were no vitals filed for this visit. There is no height or weight on file to calculate BMI. Constitutional Well developed. Well nourished.  Vascular Foot warm and well perfused. Capillary refill normal to all digits.  Calf is soft and supple, no posterior calf or knee pain, negative Homans' sign  Neurologic Normal speech. Oriented to person, place, and time. Epicritic sensation to light touch grossly present bilaterally.  Dermatologic Skin healing well without signs of infection. Skin edges well coapted without signs of infection.  Orthopedic: Tenderness to palpation noted about the surgical site.   Multiple view plain film radiographs: Interval removal of medial plate, there is interval fracture of the inferior syndesmotic screw Assessment:   1. Pain due to internal orthopedic prosthetic device, initial encounter (HCC)   2. Status post surgery   3. Impingement of ankle joint    Plan:  Patient was evaluated and treated and all questions answered.  S/p foot surgery left -Progressing as expected post-operatively.  We discussed long-term he did have quite a bit of impending scar tissue which has been debrided this combined with a PRP injection hopefully should offer him some relief as he gets back to activity.  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 -XR: Noted above, we discussed that the inferior syndesmotic screw has fractured but the remaining screw is holding and his alignment is maintained -WB Status: WBAT in cam walker boot -Sutures: He will return in 2 weeks to remove these.   Return in about 2 weeks (around 04/25/2023) for suture removal.

## 2023-04-16 ENCOUNTER — Ambulatory Visit: Payer: BC Managed Care – PPO | Admitting: Nurse Practitioner

## 2023-04-16 ENCOUNTER — Encounter: Payer: Self-pay | Admitting: Nurse Practitioner

## 2023-04-16 VITALS — BP 134/83 | HR 56 | Temp 98.0°F | Ht 73.0 in

## 2023-04-16 DIAGNOSIS — Z6838 Body mass index (BMI) 38.0-38.9, adult: Secondary | ICD-10-CM

## 2023-04-16 DIAGNOSIS — E559 Vitamin D deficiency, unspecified: Secondary | ICD-10-CM | POA: Diagnosis not present

## 2023-04-16 MED ORDER — VITAMIN D (ERGOCALCIFEROL) 1.25 MG (50000 UNIT) PO CAPS: 50000.0000 [IU] | ORAL_CAPSULE | ORAL | 0 refills | Status: DC

## 2023-04-16 NOTE — Progress Notes (Signed)
Office: [phone]  /  Fax: [phone]  WEIGHT SUMMARY AND BIOMETRICS  No data recorded No data recorded  Vitals Temp: 98 F (36.7 C) BP: 134/83 Pulse Rate: (!) 56 SpO2: 100 %   Anthropometric Measurements Height: 6\' 1"  (1.854 m)   No data recorded Other Clinical Data Fasting: No Labs: No Today's Visit #: 8 Starting Date: 11/27/22     HPI  Chief Complaint: OBESITY  Larry [name] is here to discuss his progress with his obesity treatment plan. He is on the the Category 4 Plan and states he is following his eating plan approximately 85 % of the time. He states he is exercising 0 minutes 0 days per week.   Interval History:  Since last office visit he had surgery on 04/06/23 and is wearing a boot on his left foot. We were unable to weigh him to get an accurate weight.  He has a follow up visit scheduled with Dr. Lilian Kapur on 04/25/23.  He's not skipping meals.  He is meeting his protein goals.  He's eating protein with every meal.  He drinking a protein shake, diet pepsi and water daily.   He notes that his clothes are fitting better.  Notes occasional polyphagia in the evenings.   He has decreased his beer intake since having surgery.   His goal weigh is 240 lbs  Pharmacotherapy for weight loss: He is not currently taking medications  for medical weight loss.    Previous pharmacotherapy for medical weight loss:  none  Bariatric surgery:  He has not had bariatric surgery  Vit D deficiency  He is taking Vit D 50,000 IU weekly.  Denies side effects.  Denies nausea, vomiting or muscle weakness.    Lab Results  Component Value Date   VD25OH 25.6 (L) 11/27/2022     PHYSICAL EXAM:  Blood pressure 134/83, pulse (!) 56, temperature 98 F (36.7 C), height 6\' 1"  (1.854 m), SpO2 100%. Body mass index is 38 kg/m.  General: He is overweight, cooperative, alert, well developed, and in no acute distress. PSYCH: Has normal mood, affect and thought process.   Extremities:  No edema.  Neurologic: No gross sensory or motor deficits. No tremors or fasciculations noted.    DIAGNOSTIC DATA REVIEWED:  BMET    Component Value Date/Time   NA 139 11/27/2022 1108   K 4.2 11/27/2022 1108   CL 102 11/27/2022 1108   CO2 23 11/27/2022 1108   GLUCOSE 93 11/27/2022 1108   BUN 12 11/27/2022 1108   CREATININE 0.87 11/27/2022 1108   CALCIUM 9.3 11/27/2022 1108   Lab Results  Component Value Date   HGBA1C 5.3 11/27/2022   Lab Results  Component Value Date   INSULIN 12.1 11/27/2022   Lab Results  Component Value Date   TSH 1.820 11/27/2022   CBC No results found for: "WBC", "RBC", "HGB", "HCT", "PLT", "MCV", "MCH", "MCHC", "RDW" Iron Studies No results found for: "IRON", "TIBC", "FERRITIN", "IRONPCTSAT" Lipid Panel     Component Value Date/Time   CHOL 159 11/27/2022 1108   TRIG 238 (H) 11/27/2022 1108   HDL 44 11/27/2022 1108   LDLCALC 76 11/27/2022 1108   Hepatic Function Panel     Component Value Date/Time   PROT 7.1 11/27/2022 1108   ALBUMIN 4.7 11/27/2022 1108   AST 23 11/27/2022 1108   ALT 40 11/27/2022 1108   ALKPHOS 96 11/27/2022 1108   BILITOT 1.5 (H) 11/27/2022 1108      Component Value Date/Time  TSH 1.820 11/27/2022 1108   Nutritional Lab Results  Component Value Date   VD25OH 25.6 (L) 11/27/2022     ASSESSMENT AND PLAN  TREATMENT PLAN FOR OBESITY:  Recommended Dietary Goals  Codi is currently in the action stage of change. As such, his goal is to continue weight management plan. He has agreed to the Category 4 Plan.  Behavioral Intervention  We discussed the following Behavioral Modification Strategies today: increasing lean protein intake, decreasing simple carbohydrates , increasing vegetables, increasing lower glycemic fruits, increasing water intake, work on meal planning and preparation, reading food labels , keeping healthy foods at home, avoiding temptations and identifying enticing environmental cues, continue  to practice mindfulness when eating, and planning for success.  Additional resources provided today: NA  Recommended Physical Activity Goals  He has agreed to follow surgeon recommendations.     ASSOCIATED CONDITIONS ADDRESSED TODAY  Action/Plan  Vitamin D insufficiency -     Vitamin D (Ergocalciferol); Take 1 capsule (50,000 Units total) by mouth every 7 (seven) days.  Dispense: 5 capsule; Refill: 0  Morbid obesity (HCC)  BMI 38.0-38.9,adult      Will obtain labs in 1-3 months.    Return in about 4 weeks (around 05/14/2023).Marland Kitchen He was informed of the importance of frequent follow up visits to maximize his success with intensive lifestyle modifications for his multiple health conditions.   ATTESTASTION STATEMENTS:  Reviewed by clinician on day of visit: allergies, medications, problem list, medical history, surgical history, family history, social history, and previous encounter notes.      Theodis Sato. Mariaclara Spear FNP-C

## 2023-04-25 ENCOUNTER — Ambulatory Visit (INDEPENDENT_AMBULATORY_CARE_PROVIDER_SITE_OTHER): Payer: BC Managed Care – PPO | Admitting: Podiatry

## 2023-04-25 ENCOUNTER — Encounter: Payer: Self-pay | Admitting: Podiatry

## 2023-04-25 DIAGNOSIS — M25879 Other specified joint disorders, unspecified ankle and foot: Secondary | ICD-10-CM

## 2023-04-25 DIAGNOSIS — T8484XA Pain due to internal orthopedic prosthetic devices, implants and grafts, initial encounter: Secondary | ICD-10-CM

## 2023-04-25 NOTE — Progress Notes (Signed)
  Subjective:  Patient ID: Larry [name], male    DOB: 2001-06-21,  MRN: [id number]  Chief Complaint  Patient presents with   Routine Post Op    "It's there."    DOS: 04/06/2023 Procedure: Removal of medial plate, ankle arthroscopy, PRP injection  22 y.o. male returns for post-op check.  Not having much pain been walking in the boot  Review of Systems: Negative except as noted in the HPI. Denies N/V/F/Ch.   Objective:  There were no vitals filed for this visit. There is no height or weight on file to calculate BMI. Constitutional Well developed. Well nourished.  Vascular Foot warm and well perfused. Capillary refill normal to all digits.  Calf is soft and supple, no posterior calf or knee pain, negative Homans' sign  Neurologic Normal speech. Oriented to person, place, and time. Epicritic sensation to light touch grossly present bilaterally.  Dermatologic Some small delayed healing proximally  Orthopedic: Tenderness to palpation noted about the surgical site.   Multiple view plain film radiographs: Interval removal of medial plate, there is interval fracture of the inferior syndesmotic screw Assessment:   1. Impingement of ankle joint   2. Pain due to internal orthopedic prosthetic device, initial encounter [facility])    Plan:  Patient was evaluated and treated and all questions answered.  S/p foot surgery left -Sutures removed uneventfully.  We reviewed the images from his arthroscopy.  He should apply Neosporin and a bandage to the area of delayed healing.  I would like him to gradually resume weightbearing in regular shoe gear over the next few weeks to see how his ankle does get a hightop boot that he was not able to previously tolerate because of the pressure from the plate.  If this is comfortable should be able to continue with this otherwise we will plan to get him fitted for an [place] brace.  Return in 6 weeks to reevaluate.   Return in about 6 weeks (around  06/06/2023).

## 2023-05-15 ENCOUNTER — Encounter: Payer: Self-pay | Admitting: Podiatry

## 2023-05-16 ENCOUNTER — Ambulatory Visit: Payer: BC Managed Care – PPO | Admitting: Bariatrics

## 2023-05-16 ENCOUNTER — Encounter: Payer: Self-pay | Admitting: Bariatrics

## 2023-05-16 ENCOUNTER — Encounter: Payer: BC Managed Care – PPO | Admitting: Podiatry

## 2023-05-16 VITALS — BP 123/80 | HR 56 | Temp 97.9°F | Ht 73.0 in | Wt 284.0 lb

## 2023-05-16 DIAGNOSIS — E559 Vitamin D deficiency, unspecified: Secondary | ICD-10-CM | POA: Diagnosis not present

## 2023-05-16 DIAGNOSIS — E781 Pure hyperglyceridemia: Secondary | ICD-10-CM | POA: Diagnosis not present

## 2023-05-16 DIAGNOSIS — E669 Obesity, unspecified: Secondary | ICD-10-CM | POA: Diagnosis not present

## 2023-05-16 DIAGNOSIS — Z6837 Body mass index (BMI) 37.0-37.9, adult: Secondary | ICD-10-CM | POA: Diagnosis not present

## 2023-05-16 MED ORDER — SEMAGLUTIDE-WEIGHT MANAGEMENT 0.25 MG/0.5ML ~~LOC~~ SOAJ: 0.2500 mg | SUBCUTANEOUS | 0 refills | Status: AC

## 2023-05-16 MED ORDER — VITAMIN D (ERGOCALCIFEROL) 1.25 MG (50000 UNIT) PO CAPS
50000.0000 [IU] | ORAL_CAPSULE | ORAL | 0 refills | Status: DC
Start: 2023-05-16 — End: 2023-07-17

## 2023-05-16 NOTE — Progress Notes (Signed)
WEIGHT SUMMARY AND BIOMETRICS  Weight Lost Since Last Visit: 4lb  Vitals Temp: 97.9 F (36.6 C) BP: 123/80 Pulse Rate: (!) 56 SpO2: 100 %   Anthropometric Measurements Height: 6\' 1"  (1.854 m) Weight: 284 lb (128.8 kg) BMI (Calculated): 37.48 Weight at Last Visit: 288lb Weight Lost Since Last Visit: 4lb Starting Weight: 320lb Total Weight Loss (lbs): 36 lb (16.3 kg)   Body Composition  Body Fat %: 31.5 % Fat Mass (lbs): 89.6 lbs Muscle Mass (lbs): 185.4 lbs Total Body Water (lbs): 135.2 lbs Visceral Fat Rating : 13   Other Clinical Data Fasting: no Labs: no Today's Visit #: 9 Starting Date: 11/27/22    OBESITY Larry [name] is here to discuss his progress with his obesity treatment plan along with follow-up of his obesity related diagnoses.     Nutrition Plan: the Category 4 plan - 70% adherence.  Current exercise: none  Interim History:  His weight was down 4 lbs from when I saw him in July. We were unable to weight the patient at the last visit due to him having surgery and being in a boot. He is off work at this time.  Eating all of the food on the plan., Protein intake is as prescribed, Water intake is adequate., and Denies polyphagia  Pharmacotherapy: Hunger is moderately controlled.  Cravings are moderately controlled.  Assessment/Plan:   1. Vitamin D insufficiency Vitamin D Deficiency Vitamin D is not at goal of 50.  Most recent vitamin D level was 25.6. He is on  prescription ergocalciferol 50,000 IU weekly. Lab Results  Component Value Date   VD25OH 25.6 (L) 11/27/2022    Plan: Refill prescription vitamin D 50,000 IU weekly.   Hypertriglyceridemia:  Triglycerides were high at his initial visit.  Medication(s): none Cardiovascular risk factors: male gender and obesity (BMI >= 30 kg/m2)  Lab Results  Component Value Date   CHOL 159 11/27/2022   HDL 44 11/27/2022   LDLCALC 76 11/27/2022   TRIG 238 (H) 11/27/2022   Lab Results   Component Value Date   ALT 40 11/27/2022   AST 23 11/27/2022   ALKPHOS 96 11/27/2022   BILITOT 1.5 (H) 11/27/2022   The ASCVD Risk score (Arnett DK, et al., 2019) failed to calculate for the following reasons:   The 2019 ASCVD risk score is only valid for ages 6 to 40  Will avoid all trans fats.  Will read labels Will minimize saturated fats except the following: low fat meats in moderation, diary, and limited dark chocolate.  He eating healthier cuts of meat.  He is making better choices for eating out.   His goal is to continue weight loss and get below the weight down to 250 lbs.  Will check his lipids at his next visit.    Polyphagia Larry [name] endorses excessive hunger.  Medication(s): none  Appetite poorly controlled. Cravings are poorly controlled.   Plan: Medication(s): Wegovy 0.25 mg SQ weekly Will increase water, protein and fiber to help assuage hunger.  Will minimize foods that have a high glucose index/load to minimize reactive hypoglycemia.     Generalized Obesity: Current BMI BMI (Calculated): 37.48   Larry [name] is currently in the action stage of change. As such, his goal is to continue with weight loss efforts.  He has agreed to the Category 4 plan.  Exercise goals: For substantial health benefits, adults should do at least 150 minutes (2 hours and 30 minutes) a week of moderate-intensity, or 75 minutes (1 hour and  15 minutes) a week of vigorous-intensity aerobic physical activity, or an equivalent combination of moderate- and vigorous-intensity aerobic activity. Aerobic activity should be performed in episodes of at least 10 minutes, and preferably, it should be spread throughout the week. He will do exercises that do not aggravate his ankle.   Behavioral modification strategies: increasing lean protein intake, decreasing simple carbohydrates , decrease eating out, decrease liquid calories, increase water intake, better snacking choices, and planning for  success.  Larry [name] has agreed to follow-up with our clinic in 4 weeks for an IC and fasting labs.     Objective:   VITALS: Per patient if applicable, see vitals. GENERAL: Alert and in no acute distress. CARDIOPULMONARY: No increased WOB. Speaking in clear sentences.  PSYCH: Pleasant and cooperative. Speech normal rate and rhythm. Affect is appropriate. Insight and judgement are appropriate. Attention is focused, linear, and appropriate.  NEURO: Oriented as arrived to appointment on time with no prompting.   Attestation Statements:   This was prepared with the assistance of Engineer, civil (consulting).  Occasional wrong-word or sound-a-like substitutions may have occurred due to the inherent limitations of voice recognition software.   Corinna Capra, DO

## 2023-05-21 ENCOUNTER — Telehealth: Payer: Self-pay

## 2023-05-21 NOTE — Telephone Encounter (Signed)
Scheduled next available with Dr Lilian Kapur called pt LM on VM if that date isnt good to call me back to rsc.

## 2023-05-21 NOTE — Telephone Encounter (Signed)
Started PA for [employer] via covermymeds

## 2023-05-28 NOTE — Telephone Encounter (Signed)
Prior Auth for Reginal Lutes has been denied. Patient has been notified.

## 2023-05-30 ENCOUNTER — Encounter: Payer: Self-pay | Admitting: Podiatry

## 2023-05-30 ENCOUNTER — Ambulatory Visit: Payer: BC Managed Care – PPO | Admitting: Podiatry

## 2023-05-30 VITALS — BP 130/83 | HR 65

## 2023-05-30 DIAGNOSIS — M216X2 Other acquired deformities of left foot: Secondary | ICD-10-CM

## 2023-05-30 DIAGNOSIS — L84 Corns and callosities: Secondary | ICD-10-CM | POA: Diagnosis not present

## 2023-05-30 DIAGNOSIS — M7742 Metatarsalgia, left foot: Secondary | ICD-10-CM | POA: Diagnosis not present

## 2023-05-30 DIAGNOSIS — M25879 Other specified joint disorders, unspecified ankle and foot: Secondary | ICD-10-CM

## 2023-05-30 DIAGNOSIS — T8484XA Pain due to internal orthopedic prosthetic devices, implants and grafts, initial encounter: Secondary | ICD-10-CM

## 2023-05-30 DIAGNOSIS — M7752 Other enthesopathy of left foot: Secondary | ICD-10-CM | POA: Diagnosis not present

## 2023-05-30 NOTE — Progress Notes (Unsigned)
  Subjective:  Patient ID: Larry [name], male    DOB: 2000-11-01,  MRN: [id number]  Chief Complaint  Patient presents with   Foot Pain    "I have a place over here that hurts and it kind of moves." (Pain 5th met left)    DOS: 04/06/2023 Procedure: Removal of medial plate, ankle arthroscopy, PRP injection  22 y.o. male returns for post-op check.  Not having much pain been walking in the boot  Review of Systems: Negative except as noted in the HPI. Denies N/V/F/Ch.   Objective:   Vitals:   05/30/23 1444  BP: 130/83  Pulse: 65   There is no height or weight on file to calculate BMI. Constitutional Well developed. Well nourished.  Vascular Foot warm and well perfused. Capillary refill normal to all digits.  Calf is soft and supple, no posterior calf or knee pain, negative Homans' sign  Neurologic Normal speech. Oriented to person, place, and time. Epicritic sensation to light touch grossly present bilaterally.  Dermatologic Some small delayed healing proximally  Orthopedic: Tenderness to palpation noted about the surgical site.   Multiple view plain film radiographs: Interval removal of medial plate, there is interval fracture of the inferior syndesmotic screw Assessment:   No diagnosis found.  Plan:  Patient was evaluated and treated and all questions answered.  S/p foot surgery left -Sutures removed uneventfully.  We reviewed the images from his arthroscopy.  He should apply Neosporin and a bandage to the area of delayed healing.  I would like him to gradually resume weightbearing in regular shoe gear over the next few weeks to see how his ankle does get a hightop boot that he was not able to previously tolerate because of the pressure from the plate.  If this is comfortable should be able to continue with this otherwise we will plan to get him fitted for an [place] brace.  Return in 6 weeks to reevaluate.   No follow-ups on file.

## 2023-06-05 ENCOUNTER — Telehealth (INDEPENDENT_AMBULATORY_CARE_PROVIDER_SITE_OTHER): Payer: Self-pay | Admitting: Bariatrics

## 2023-06-05 NOTE — Telephone Encounter (Signed)
Left message for patient to return call.

## 2023-06-05 NOTE — Telephone Encounter (Signed)
Pt called stating that he was returning a call from [facility].

## 2023-06-06 ENCOUNTER — Ambulatory Visit: Payer: BC Managed Care – PPO | Admitting: Podiatry

## 2023-06-06 ENCOUNTER — Encounter: Payer: Self-pay | Admitting: Bariatrics

## 2023-06-06 NOTE — Telephone Encounter (Signed)
Spoke to patient and told him per Dr. Coralee Rud that he can call his insurance and see what other medications are covered, and will discussed at next follow-up visit. Patient verbalized understanding.

## 2023-06-15 ENCOUNTER — Ambulatory Visit: Payer: BC Managed Care – PPO

## 2023-06-15 NOTE — Progress Notes (Signed)
Patient was present with mother and evaluated for Custom molded foot orthotics. Patient will benefit from CFO's to provide total contact to BIL MLA's helping to balance and distribute body weight more evenly across BIL feet helping to reduce plantar pressure and pain. Orthotic will also encourage FF / RF alignment lateral posting flanges and offloads will help to reduce supination to reduce pressure and pain from lateral aspect BIL  Patient was scanned today and will return for fitting upon receipt  Financials signed  Addison Bailey Cped, CFo, CFm

## 2023-06-19 ENCOUNTER — Ambulatory Visit: Payer: BC Managed Care – PPO | Admitting: Bariatrics

## 2023-06-19 ENCOUNTER — Encounter: Payer: Self-pay | Admitting: Bariatrics

## 2023-06-19 ENCOUNTER — Other Ambulatory Visit (HOSPITAL_COMMUNITY): Payer: Self-pay

## 2023-06-19 VITALS — BP 126/80 | HR 67 | Temp 97.9°F | Ht 73.0 in | Wt 288.0 lb

## 2023-06-19 DIAGNOSIS — R5383 Other fatigue: Secondary | ICD-10-CM

## 2023-06-19 DIAGNOSIS — Z6838 Body mass index (BMI) 38.0-38.9, adult: Secondary | ICD-10-CM

## 2023-06-19 DIAGNOSIS — E781 Pure hyperglyceridemia: Secondary | ICD-10-CM | POA: Diagnosis not present

## 2023-06-19 DIAGNOSIS — E669 Obesity, unspecified: Secondary | ICD-10-CM | POA: Diagnosis not present

## 2023-06-19 DIAGNOSIS — E66812 Obesity, class 2: Secondary | ICD-10-CM

## 2023-06-19 MED ORDER — WEGOVY 0.25 MG/0.5ML ~~LOC~~ SOAJ
0.2500 mg | SUBCUTANEOUS | 0 refills | Status: DC
  Filled 2023-06-19: qty 2, 28d supply, fill #0

## 2023-06-19 NOTE — Progress Notes (Signed)
WEIGHT SUMMARY AND BIOMETRICS  Weight Lost Since Last Visit: 0  Weight Gained Since Last Visit: 4lb   Vitals Temp: 97.9 F (36.6 C) BP: 126/80 Pulse Rate: 67 SpO2: 99 %   Anthropometric Measurements Height: 6\' 1"  (1.854 m) Weight: 288 lb (130.6 kg) BMI (Calculated): 38.01 Weight at Last Visit: 284lb Weight Lost Since Last Visit: 0 Weight Gained Since Last Visit: 4lb Starting Weight: 320lb Total Weight Loss (lbs): 32 lb (14.5 kg)   Body Composition  Body Fat %: 31.2 % Fat Mass (lbs): 90 lbs Muscle Mass (lbs): 189 lbs Total Body Water (lbs): 137 lbs Visceral Fat Rating : 14   Other Clinical Data Fasting: yes Labs: yes Today's Visit #: 10 Starting Date: 11/27/22    OBESITY Ralpheal is here to discuss his progress with his obesity treatment plan along with follow-up of his obesity related diagnoses.     Nutrition Plan: the Category 4 plan - 50% adherence.  Current exercise: walking  Interim History:  He is up 4 lbs since his last visit. He was prescribed Wegovy, but was unable to get due to availability.  Eating all of the food on the plan., Protein intake is as prescribed, Is not skipping meals, and Water intake is adequate.  Pharmacotherapy: Mahin was placed on Wegovy at the last visit, but has been unable to get the medications.  Hunger is poorly controlled.  Cravings are moderately controlled.  Assessment/Plan:   Hypertriglyceridemia:  Hyperlipidemia LDL is at goal. His triglycerides are elevated at 238 Medication(s): none Cardiovascular risk factors: male gender, obesity (BMI >= 30 kg/m2), and sedentary lifestyle  Lab Results  Component Value Date   CHOL 159 11/27/2022   HDL 44 11/27/2022   LDLCALC 76 11/27/2022   TRIG 238 (H) 11/27/2022   Lab Results  Component Value Date   ALT 40 11/27/2022   AST 23 11/27/2022   ALKPHOS 96 11/27/2022   BILITOT 1.5 (H) 11/27/2022   The ASCVD Risk score (Arnett DK, et al., 2019) failed  to calculate for the following reasons:   The 2019 ASCVD risk score is only valid for ages 43 to 33  Plan:  Information sheet on healthy vs unhealthy fats.  Will avoid all trans fats.  Will read labels Will minimize saturated fats except the following: low fat meats in moderation, diary, and limited dark chocolate.  Limit carbohydrates and focus on low sugar fruits and complex carbohydrates.      Generalized Obesity: Current BMI BMI (Calculated): 38.01   Pharmacotherapy Plan Start  Wegovy 0.25 mg SQ weekly  Gerik is currently in the action stage of change. As such, his goal is to continue with weight loss efforts.  He has agreed to the Category 4 plan.  Exercise goals: For additional and more extensive health benefits, adults should increase their aerobic physical activity to 300 minutes (5 hours) a week of moderate-intensity, or 150 minutes a week of vigorous-intensity aerobic physical activity, or an equivalent combination of moderate- and vigorous-intensity activity. Additional health benefits are gained by engaging in physical activity beyond this amount.  He will continue to walk and will increase over time.  He has had multiple surgeries on his ankle and is slowly trying to increase his strength and endurance.   Behavioral modification strategies: increasing lean protein intake, no meal skipping, better snacking choices, planning for success, increasing vegetables, avoiding temptations, and mindful eating.  Tamaris has agreed to follow-up with our clinic in 4 weeks.  Objective:   VITALS: Per patient if applicable, see vitals. GENERAL: Alert and in no acute distress. CARDIOPULMONARY: No increased WOB. Speaking in clear sentences.  PSYCH: Pleasant and cooperative. Speech normal rate and rhythm. Affect is appropriate. Insight and judgement are appropriate. Attention is focused, linear, and appropriate.  NEURO: Oriented as arrived to appointment on time with no prompting.    Attestation Statements:   This was prepared with the assistance of Engineer, civil (consulting).  Occasional wrong-word or sound-a-like substitutions may have occurred due to the inherent limitations of voice recognition software.   Corinna Capra, DO

## 2023-06-25 ENCOUNTER — Ambulatory Visit: Payer: BC Managed Care – PPO | Admitting: Podiatry

## 2023-06-25 ENCOUNTER — Encounter: Payer: Self-pay | Admitting: Podiatry

## 2023-06-25 VITALS — BP 143/81 | HR 80

## 2023-06-25 DIAGNOSIS — M7752 Other enthesopathy of left foot: Secondary | ICD-10-CM

## 2023-06-25 DIAGNOSIS — M216X2 Other acquired deformities of left foot: Secondary | ICD-10-CM

## 2023-06-25 DIAGNOSIS — M7742 Metatarsalgia, left foot: Secondary | ICD-10-CM

## 2023-06-25 DIAGNOSIS — M25879 Other specified joint disorders, unspecified ankle and foot: Secondary | ICD-10-CM

## 2023-06-25 DIAGNOSIS — T8484XA Pain due to internal orthopedic prosthetic devices, implants and grafts, initial encounter: Secondary | ICD-10-CM

## 2023-06-25 NOTE — Progress Notes (Signed)
  Subjective:  Patient ID: Larry [name], male    DOB: Mar 03, 2001,  MRN: [id number]  Chief Complaint  Patient presents with   Foot Pain    "It's doing pretty good."    DOS: 04/06/2023 Procedure: Removal of medial plate, ankle arthroscopy, PRP injection  22 y.o. male returns for post-op check.  He is doing well, felt a crack in the right  ankle dancing but is getting better, has been fitted for CMOs but not received yet   Review of Systems: Negative except as noted in the HPI. Denies N/V/F/Ch.   Objective:   Vitals:   06/25/23 0859  BP: (!) 143/81  Pulse: 80   There is no height or weight on file to calculate BMI. Constitutional Well developed. Well nourished.  Vascular Foot warm and well perfused. Capillary refill normal to all digits.  Calf is soft and supple, no posterior calf or knee pain, negative Homans' sign  Neurologic Normal speech. Oriented to person, place, and time. Epicritic sensation to light touch grossly present bilaterally.  Dermatologic Non hypertrophic incision  Orthopedic: No pain to palpation 5th metatarsal, ankle joint with good ROM and pain free, R ankle mild tenderness over sinus tarsi   Multiple view plain film radiographs: Interval removal of medial plate, there is interval fracture of the inferior syndesmotic screw Assessment:   1. Plantar flexed metatarsal bone of left foot   2. Bursitis of left foot   3. Metatarsalgia, left foot   4. Impingement of ankle joint   5. 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  Doing well on the left. He will be notified when his CMOs are ready.  Has a mild sprain on the R anklle, recommended home PT exercises and expect this will resolve uneventfully, return to see me if it does not   Return if symptoms worsen or fail to improve.

## 2023-07-17 ENCOUNTER — Ambulatory Visit: Payer: BC Managed Care – PPO | Admitting: Bariatrics

## 2023-07-17 ENCOUNTER — Encounter: Payer: Self-pay | Admitting: Bariatrics

## 2023-07-17 ENCOUNTER — Other Ambulatory Visit (HOSPITAL_COMMUNITY): Payer: Self-pay

## 2023-07-17 VITALS — HR 71 | Temp 97.4°F | Ht 73.0 in | Wt 282.0 lb

## 2023-07-17 DIAGNOSIS — R632 Polyphagia: Secondary | ICD-10-CM

## 2023-07-17 DIAGNOSIS — E669 Obesity, unspecified: Secondary | ICD-10-CM | POA: Diagnosis not present

## 2023-07-17 DIAGNOSIS — E66812 Obesity, class 2: Secondary | ICD-10-CM

## 2023-07-17 DIAGNOSIS — E559 Vitamin D deficiency, unspecified: Secondary | ICD-10-CM

## 2023-07-17 DIAGNOSIS — E781 Pure hyperglyceridemia: Secondary | ICD-10-CM | POA: Diagnosis not present

## 2023-07-17 DIAGNOSIS — Z6837 Body mass index (BMI) 37.0-37.9, adult: Secondary | ICD-10-CM

## 2023-07-17 MED ORDER — VITAMIN D (ERGOCALCIFEROL) 1.25 MG (50000 UNIT) PO CAPS
50000.0000 [IU] | ORAL_CAPSULE | ORAL | 0 refills | Status: DC
  Filled 2023-07-17: qty 4, 28d supply, fill #0

## 2023-07-17 MED ORDER — WEGOVY 0.5 MG/0.5ML ~~LOC~~ SOAJ
0.5000 mg | SUBCUTANEOUS | 0 refills | Status: DC
  Filled 2023-07-17: qty 2, 28d supply, fill #0

## 2023-07-17 NOTE — Progress Notes (Signed)
WEIGHT SUMMARY AND BIOMETRICS  Weight Lost Since Last Visit: 6lb  Weight Gained Since Last Visit: 0   Vitals Temp: (!) 97.4 F (36.3 C) Pulse Rate: 71 SpO2: 98 %   Anthropometric Measurements Height: 6\' 1"  (1.854 m) Weight: 282 lb (127.9 kg) BMI (Calculated): 37.21 Weight at Last Visit: 288lb Weight Lost Since Last Visit: 6lb Weight Gained Since Last Visit: 0 Starting Weight: 320lb Total Weight Loss (lbs): 38 lb (17.2 kg)   Body Composition  Body Fat %: 29.7 % Fat Mass (lbs): 83.8 lbs Muscle Mass (lbs): 188.8 lbs Total Body Water (lbs): 134.8 lbs Visceral Fat Rating : 12   Other Clinical Data Fasting: no Labs: no Today's Visit #: 11 Starting Date: 11/27/22    OBESITY Larry [name] is here to discuss his progress with his obesity treatment plan along with follow-up of his obesity related diagnoses.    Nutrition Plan: the Category 4 plan - 70% adherence.  Current exercise: walking  Interim History:  He is down another 6 lbs since his last visit.  Eating all of the food on the plan., Protein intake is as prescribed, Meeting calorie goals., and Water intake is adequate.   Pharmacotherapy: Larry [name] is on Wegovy 0.25 mg SQ weekly Adverse side effects: None [name] is moderately controlled.  Cravings are moderately controlled.  Assessment/Plan:   1. Vitamin D insufficiency Vitamin D Deficiency Vitamin D is at goal of 50.  Most recent vitamin D level was 25.6. He is on  prescription ergocalciferol 50,000 IU weekly. Lab Results  Component Value Date   VD25OH 25.6 (L) 11/27/2022    Plan: Refill prescription vitamin D 50,000 IU weekly.   2. Hypertriglyceridemia  Triglycerides are is not at goal. Medication(s): none Cardiovascular risk factors: male gender, obesity (BMI >= 30 kg/m2), and sedentary lifestyle  Lab Results  Component Value Date    CHOL 159 11/27/2022   HDL 44 11/27/2022   LDLCALC 76 11/27/2022   TRIG 238 (H) 11/27/2022   Lab Results  Component Value Date   ALT 40 11/27/2022   AST 23 11/27/2022   ALKPHOS 96 11/27/2022   BILITOT 1.5 (H) 11/27/2022   The ASCVD Risk score (Arnett DK, et al., 2019) failed to calculate for the following reasons:   The 2019 ASCVD risk score is only valid for ages 59 to 19  Plan:  Information sheet on healthy vs unhealthy fats.  Will avoid all trans fats.  Will read labels Will minimize saturated fats except the following: low fat meats in moderation, diary, and limited dark chocolate.  Increase Omega 3 in foods, and consider an Omega 3 supplement.     Polyphagia Larry [name].  Medication(s): ZHYQMV Effects of medication:  moderately controlled. Cravings are moderately controlled.   Plan: Medication(s): Wegovy 0.50 mg SQ weekly Will increase water, protein and fiber to help assuage [name].  Will  minimize foods that have a high glucose index/load to minimize reactive hypoglycemia.     Generalized Obesity: Current BMI BMI (Calculated): 37.21   Pharmacotherapy Plan Continue and increase dose  Wegovy 0.50 mg SQ weekly  Larry [name] is currently in the action stage of change. As such, his goal is to continue with weight loss efforts.  He has agreed to the Category 4 plan.  Exercise goals: For substantial health benefits, adults should do at least 150 minutes (2 hours and 30 minutes) a week of moderate-intensity, or 75 minutes (1 hour and 15 minutes) a week of vigorous-intensity aerobic physical activity, or an equivalent combination of moderate- and vigorous-intensity aerobic activity. Aerobic activity should be performed in episodes of at least 10 minutes, and preferably, it should be spread throughout the week.  Behavioral modification strategies: increasing lean protein intake, decreasing simple carbohydrates , no meal skipping, decrease eating out, decrease  liquid calories, increase water intake, planning for success, increasing vegetables, increasing fiber rich foods, get rid of junk food in the home, avoiding temptations, travel eating strategies, weigh protein portions, measure portion sizes, and mindful eating.  Larry [name] has agreed to follow-up with our clinic in 4 weeks.     Objective:   VITALS: Per patient if applicable, see vitals. GENERAL: Alert and in no acute distress. CARDIOPULMONARY: No increased WOB. Speaking in clear sentences.  PSYCH: Pleasant and cooperative. Speech normal rate and rhythm. Affect is appropriate. Insight and judgement are appropriate. Attention is focused, linear, and appropriate.  NEURO: Oriented as arrived to appointment on time with no prompting.   Attestation Statements:   This was prepared with the assistance of Engineer, civil (consulting).  Occasional wrong-word or sound-a-like substitutions may have occurred due to the inherent limitations of voice recognition   Corinna Capra, DO

## 2023-08-07 ENCOUNTER — Ambulatory Visit (INDEPENDENT_AMBULATORY_CARE_PROVIDER_SITE_OTHER): Payer: BC Managed Care – PPO

## 2023-08-07 DIAGNOSIS — T8484XA Pain due to internal orthopedic prosthetic devices, implants and grafts, initial encounter: Secondary | ICD-10-CM

## 2023-08-07 DIAGNOSIS — M7742 Metatarsalgia, left foot: Secondary | ICD-10-CM | POA: Diagnosis not present

## 2023-08-07 DIAGNOSIS — M2141 Flat foot [pes planus] (acquired), right foot: Secondary | ICD-10-CM | POA: Diagnosis not present

## 2023-08-07 DIAGNOSIS — M25879 Other specified joint disorders, unspecified ankle and foot: Secondary | ICD-10-CM | POA: Diagnosis not present

## 2023-08-07 DIAGNOSIS — M216X2 Other acquired deformities of left foot: Secondary | ICD-10-CM

## 2023-08-07 NOTE — Progress Notes (Signed)
Patient presents today to pick up custom molded foot orthotics, diagnosed with plantar flexed MT, post op status, Pes Planus by Dr. Lilian Kapur .   Orthotics were dispensed and fit was satisfactory. Reviewed instructions for break-in and wear. Written instructions given to patient.  Patient will follow up as needed.   Larry [name] Cped, CFo, CFm

## 2023-08-13 ENCOUNTER — Encounter: Payer: Self-pay | Admitting: Podiatry

## 2023-08-14 ENCOUNTER — Other Ambulatory Visit (HOSPITAL_COMMUNITY): Payer: Self-pay

## 2023-08-14 ENCOUNTER — Encounter: Payer: Self-pay | Admitting: Bariatrics

## 2023-08-14 ENCOUNTER — Other Ambulatory Visit: Payer: Self-pay

## 2023-08-14 ENCOUNTER — Ambulatory Visit: Payer: BC Managed Care – PPO | Admitting: Bariatrics

## 2023-08-14 VITALS — BP 114/73 | HR 80 | Temp 97.6°F | Ht 73.0 in | Wt 279.0 lb

## 2023-08-14 DIAGNOSIS — Z6836 Body mass index (BMI) 36.0-36.9, adult: Secondary | ICD-10-CM

## 2023-08-14 DIAGNOSIS — E781 Pure hyperglyceridemia: Secondary | ICD-10-CM

## 2023-08-14 DIAGNOSIS — R7309 Other abnormal glucose: Secondary | ICD-10-CM | POA: Diagnosis not present

## 2023-08-14 DIAGNOSIS — R632 Polyphagia: Secondary | ICD-10-CM

## 2023-08-14 DIAGNOSIS — E559 Vitamin D deficiency, unspecified: Secondary | ICD-10-CM | POA: Diagnosis not present

## 2023-08-14 DIAGNOSIS — E669 Obesity, unspecified: Secondary | ICD-10-CM

## 2023-08-14 MED ORDER — WEGOVY 1 MG/0.5ML ~~LOC~~ SOAJ
1.0000 mg | SUBCUTANEOUS | 0 refills | Status: DC
  Filled 2023-08-14: qty 2, 28d supply, fill #0

## 2023-08-14 MED ORDER — VITAMIN D (ERGOCALCIFEROL) 1.25 MG (50000 UNIT) PO CAPS
50000.0000 [IU] | ORAL_CAPSULE | ORAL | 0 refills | Status: DC
Start: 2023-08-14 — End: 2023-09-11
  Filled 2023-08-14: qty 4, 28d supply, fill #0

## 2023-08-14 NOTE — Progress Notes (Signed)
WEIGHT SUMMARY AND BIOMETRICS  Weight Lost Since Last Visit: 3lb  Weight Gained Since Last Visit: 0   Vitals Temp: 97.6 F (36.4 C) BP: 114/73 Pulse Rate: 80 SpO2: 98 %   Anthropometric Measurements Height: 6\' 1"  (1.854 m) Weight: 279 lb (126.6 kg) BMI (Calculated): 36.82 Weight at Last Visit: 282lb Weight Lost Since Last Visit: 3lb Weight Gained Since Last Visit: 0 Starting Weight: 320lb Total Weight Loss (lbs): 41 lb (18.6 kg)   Body Composition  Body Fat %: 29.7 % Fat Mass (lbs): 83 lbs Muscle Mass (lbs): 186.8 lbs Total Body Water (lbs): 132.4 lbs Visceral Fat Rating : 12   Other Clinical Data Fasting: yes Labs: yes Today's Visit #: 12 Starting Date: 11/27/22    OBESITY Yosniel is here to discuss his progress with his obesity treatment plan along with follow-up of his obesity related diagnoses.    Nutrition Plan: the Category 4 plan - 75% adherence.  Current exercise: walking  Interim History:  He is down another 3 lbs since his last visit.  Eating all of the food on the plan., Protein intake is as prescribed, Is not skipping meals, and Meeting protein goals.   Pharmacotherapy: Dontavion is on Wegovy 0.50 mg SQ weekly Adverse side effects: None Hunger is moderately controlled.  Cravings are moderately controlled.  Assessment/Plan:   1. Vitamin D insufficiency Vitamin D Deficiency Vitamin D is not at goal of 50.  Most recent vitamin D level was 25.6. He is on  prescription ergocalciferol 50,000 IU weekly. Lab Results  Component Value Date   VD25OH 25.6 (L) 11/27/2022    Plan: Refill prescription vitamin D 50,000 IU weekly.  Will check vitamin D today.   Polyphagia Muneer endorses excessive hunger.  Medication(s): NUUVOZ Effects of medication:  moderately controlled. Cravings are moderately controlled.    Plan: Medication(s): Wegovy 0.50 mg SQ weekly Will increase water, protein and fiber to help assuage hunger.  Will minimize foods that have a high glucose index/load to minimize reactive hypoglycemia.   Hypertriglyceridemia:  Triglycerides are not at goal.  LDL is at goal. Medication(s): none Cardiovascular risk factors: male gender, obesity (BMI >= 30 kg/m2), and sedentary lifestyle  Lab Results  Component Value Date   CHOL 159 11/27/2022   HDL 44 11/27/2022   LDLCALC 76 11/27/2022   TRIG 238 (H) 11/27/2022   Lab Results  Component Value Date   ALT 40 11/27/2022   AST 23 11/27/2022   ALKPHOS 96 11/27/2022   BILITOT 1.5 (H) 11/27/2022   The ASCVD Risk score (Arnett DK, et al., 2019) failed to calculate for the following reasons:   The 2019 ASCVD risk score is only valid for ages 32 to 22  Plan:  Will avoid all trans fats.  Will read labels Will minimize saturated fats except the following: low fat meats in moderation, diary, and limited dark  chocolate.  Will check Lipids and CMP today.   Elevated glucose:   No medications and no history of diabetes.   Plan: will check HgbA1c and insulin today.      Generalized Obesity: Current BMI BMI (Calculated): 36.82   Pharmacotherapy Plan Continue and increase dose  Wegovy 1.0 mg SQ weekly  Reznor is currently in the action stage of change. As such, his goal is to continue with weight loss efforts.  He has agreed to the Category 4 plan.  Exercise goals: For substantial health benefits, adults should do at least 150 minutes (2 hours and 30 minutes) a week of moderate-intensity, or 75 minutes (1 hour and 15 minutes) a week of vigorous-intensity aerobic physical activity, or an equivalent combination of moderate- and vigorous-intensity aerobic activity. Aerobic activity should be performed in episodes of at least 10 minutes, and preferably, it should be spread throughout the week. He is walking and occasionally has some pain.    Behavioral modification strategies: meal planning , increase water intake, better snacking choices, planning for success, increasing vegetables, ways to avoid night time snacking, avoiding temptations, and keep healthy foods in the home.  Khadim has agreed to follow-up with our clinic in 4 weeks.    Objective:   VITALS: Per patient if applicable, see vitals. GENERAL: Alert and in no acute distress. CARDIOPULMONARY: No increased WOB. Speaking in clear sentences.  PSYCH: Pleasant and cooperative. Speech normal rate and rhythm. Affect is appropriate. Insight and judgement are appropriate. Attention is focused, linear, and appropriate.  NEURO: Oriented as arrived to appointment on time with no prompting.   Attestation Statements:    This was prepared with the assistance of Engineer, civil (consulting).  Occasional wrong-word or sound-a-like substitutions may have occurred due to the inherent limitations of voice recognition   Corinna Capra, DO

## 2023-08-15 LAB — COMPREHENSIVE METABOLIC PANEL
ALT: 24 [IU]/L (ref 0–44)
AST: 15 [IU]/L (ref 0–40)
Albumin: 4.5 g/dL (ref 4.3–5.2)
Alkaline Phosphatase: 75 [IU]/L (ref 44–121)
BUN/Creatinine Ratio: 15 (ref 9–20)
BUN: 13 mg/dL (ref 6–20)
Bilirubin Total: 1.4 mg/dL — ABNORMAL HIGH (ref 0.0–1.2)
CO2: 23 mmol/L (ref 20–29)
Calcium: 9.5 mg/dL (ref 8.7–10.2)
Chloride: 104 mmol/L (ref 96–106)
Creatinine, Ser: 0.89 mg/dL (ref 0.76–1.27)
Globulin, Total: 2.6 g/dL (ref 1.5–4.5)
Glucose: 95 mg/dL (ref 70–99)
Potassium: 4.4 mmol/L (ref 3.5–5.2)
Sodium: 141 mmol/L (ref 134–144)
Total Protein: 7.1 g/dL (ref 6.0–8.5)
eGFR: 123 mL/min/{1.73_m2} (ref >59)

## 2023-08-15 LAB — LIPID PANEL WITH LDL/HDL RATIO
Cholesterol, Total: 135 mg/dL (ref 100–199)
HDL: 40 mg/dL (ref >39)
LDL Chol Calc (NIH): 71 mg/dL (ref 0–99)
LDL/HDL Ratio: 1.8 {ratio} (ref 0.0–3.6)
Triglycerides: 136 mg/dL (ref 0–149)
VLDL Cholesterol Cal: 24 mg/dL (ref 5–40)

## 2023-08-15 LAB — INSULIN, RANDOM: INSULIN: 24.3 u[IU]/mL (ref 2.6–24.9)

## 2023-08-15 LAB — HEMOGLOBIN A1C
Est. average glucose Bld gHb Est-mCnc: 103 mg/dL
Hgb A1c MFr Bld: 5.2 % (ref 4.8–5.6)

## 2023-08-15 LAB — VITAMIN D 25 HYDROXY (VIT D DEFICIENCY, FRACTURES): Vit D, 25-Hydroxy: 38.5 ng/mL (ref 30.0–100.0)

## 2023-09-11 ENCOUNTER — Encounter: Payer: Self-pay | Admitting: Bariatrics

## 2023-09-11 ENCOUNTER — Other Ambulatory Visit (HOSPITAL_COMMUNITY): Payer: Self-pay

## 2023-09-11 ENCOUNTER — Ambulatory Visit: Payer: BC Managed Care – PPO | Admitting: Bariatrics

## 2023-09-11 VITALS — BP 108/72 | HR 75 | Temp 97.4°F | Ht 73.0 in | Wt 269.0 lb

## 2023-09-11 DIAGNOSIS — E669 Obesity, unspecified: Secondary | ICD-10-CM

## 2023-09-11 DIAGNOSIS — E559 Vitamin D deficiency, unspecified: Secondary | ICD-10-CM

## 2023-09-11 DIAGNOSIS — Z6835 Body mass index (BMI) 35.0-35.9, adult: Secondary | ICD-10-CM | POA: Diagnosis not present

## 2023-09-11 DIAGNOSIS — R632 Polyphagia: Secondary | ICD-10-CM

## 2023-09-11 MED ORDER — WEGOVY 1 MG/0.5ML ~~LOC~~ SOAJ
1.0000 mg | SUBCUTANEOUS | 0 refills | Status: DC
  Filled 2023-09-11: qty 2, 28d supply, fill #0

## 2023-09-11 MED ORDER — VITAMIN D (ERGOCALCIFEROL) 1.25 MG (50000 UNIT) PO CAPS
50000.0000 [IU] | ORAL_CAPSULE | ORAL | 0 refills | Status: DC
  Filled 2023-09-11: qty 4, 28d supply, fill #0

## 2023-09-11 NOTE — Progress Notes (Signed)
 WEIGHT SUMMARY AND BIOMETRICS  Weight Lost Since Last Visit: 10lb  Weight Gained Since Last Visit: 0   Vitals Temp: (!) 97.4 F (36.3 C) BP: 108/72 Pulse Rate: 75 SpO2: 98 %   Anthropometric Measurements Height: 6' 1 (1.854 m) Weight: 269 lb (122 kg) BMI (Calculated): 35.5 Weight at Last Visit: 279lb Weight Lost Since Last Visit: 10lb Weight Gained Since Last Visit: 0 Starting Weight: 320lb Total Weight Loss (lbs): 51 lb (23.1 kg)   Body Composition  Body Fat %: 29.6 % Fat Mass (lbs): 79.8 lbs Muscle Mass (lbs): 180.4 lbs Total Body Water (lbs): 127.4 lbs Visceral Fat Rating : 12   Other Clinical Data Fasting: no Labs: no Today's Visit #: 13 Starting Date: 11/27/22    OBESITY Larry [name] is here to discuss his progress with his obesity treatment plan along with follow-up of his obesity related diagnoses.    Nutrition Plan: the Category 4 plan - 75-80% adherence.  Current exercise: none  Interim History:  He is down another 10 lbs since his last visit. He states that he is drinking less beer.  Eating all of the food on the plan., Protein intake is as prescribed, and Water intake is inadequate.   Pharmacotherapy: Larry [name] is on Wegovy  1.0 mg SQ weekly Adverse side effects: None Hunger is moderately controlled.  Cravings are moderately controlled.  Assessment/Plan:   Vitamin D  Deficiency Vitamin D  is at goal of 50.  Most recent vitamin D  level was 38.5. He is on  prescription ergocalciferol  50,000 IU weekly. Lab Results  Component Value Date   VD25OH 38.5 08/14/2023   VD25OH 25.6 (L) 11/27/2022    Plan: Continue prescription vitamin D  50,000 IU weekly.   Polyphagia Larry [name] endorses excessive hunger.  Medication(s): Wegovy  Effects of medication:  moderately controlled. Cravings are moderately controlled.   Plan: Medication(s):  Wegovy  1.0 mg SQ weekly Will increase water, protein and fiber to help assuage hunger. He will minimize his ETOH level.  Will minimize foods that have a high glucose index/load to minimize reactive hypoglycemia.       Generalized Obesity: Current BMI BMI (Calculated): 35.5   Pharmacotherapy Plan Continue and refill  Wegovy  1.0 mg SQ weekly  Larry [name] is currently in the action stage of change. As such, his goal is to continue with weight loss efforts.  He has agreed to the Category 4 plan.  Exercise goals: For additional and more extensive health benefits, adults should increase their aerobic physical activity to 300 minutes (5 hours) a week of moderate-intensity, or 150 minutes a week of vigorous-intensity aerobic physical activity, or an equivalent combination of moderate- and vigorous-intensity activity. Additional health benefits are gained by engaging in physical activity beyond this amount.   Behavioral modification strategies: increasing lean protein intake, no meal skipping, meal planning , increase water intake, better snacking choices, planning for success, avoiding temptations, and  weigh protein portions.  Larry [name] has agreed to follow-up with our clinic in 4 weeks.       Objective:   VITALS: Per patient if applicable, see vitals. GENERAL: Alert and in no acute distress. CARDIOPULMONARY: No increased WOB. Speaking in clear sentences.  PSYCH: Pleasant and cooperative. Speech normal rate and rhythm. Affect is appropriate. Insight and judgement are appropriate. Attention is focused, linear, and appropriate.  NEURO: Oriented as arrived to appointment on time with no prompting.   Attestation Statements:   This was prepared with the assistance of Engineer, Civil (consulting).  Occasional wrong-word or sound-a-like substitutions may have occurred due to the inherent limitations of voice recognition   Clayborne Daring, DO

## 2023-10-02 ENCOUNTER — Encounter: Payer: Self-pay | Admitting: Podiatry

## 2023-10-03 ENCOUNTER — Ambulatory Visit: Payer: BC Managed Care – PPO

## 2023-10-03 ENCOUNTER — Encounter: Payer: Self-pay | Admitting: Podiatry

## 2023-10-03 NOTE — Progress Notes (Signed)
 Patient was here and has orthotics adjusted feels lateral flange rubbing on outside of foot  I heated and thinned out lateral flanges and had patient weight bear until they flared  Once cooled I smoothed edges  Patient is very happy with result inquired about getting a 2nd pair  He will call if he decides to order at half off by 02/05/2024

## 2023-10-09 ENCOUNTER — Other Ambulatory Visit (HOSPITAL_COMMUNITY): Payer: Self-pay

## 2023-10-09 ENCOUNTER — Ambulatory Visit: Payer: BC Managed Care – PPO | Admitting: Bariatrics

## 2023-10-09 ENCOUNTER — Encounter: Payer: Self-pay | Admitting: Bariatrics

## 2023-10-09 VITALS — BP 113/72 | HR 58 | Temp 98.1°F | Ht 73.0 in | Wt 267.0 lb

## 2023-10-09 DIAGNOSIS — R632 Polyphagia: Secondary | ICD-10-CM | POA: Diagnosis not present

## 2023-10-09 DIAGNOSIS — E669 Obesity, unspecified: Secondary | ICD-10-CM | POA: Diagnosis not present

## 2023-10-09 DIAGNOSIS — Z6835 Body mass index (BMI) 35.0-35.9, adult: Secondary | ICD-10-CM | POA: Diagnosis not present

## 2023-10-09 DIAGNOSIS — E559 Vitamin D deficiency, unspecified: Secondary | ICD-10-CM | POA: Diagnosis not present

## 2023-10-09 DIAGNOSIS — E6609 Other obesity due to excess calories: Secondary | ICD-10-CM

## 2023-10-09 MED ORDER — WEGOVY 1 MG/0.5ML ~~LOC~~ SOAJ
1.0000 mg | SUBCUTANEOUS | 0 refills | Status: DC
  Filled 2023-10-09: qty 2, 28d supply, fill #0

## 2023-10-09 MED ORDER — VITAMIN D (ERGOCALCIFEROL) 1.25 MG (50000 UNIT) PO CAPS
50000.0000 [IU] | ORAL_CAPSULE | ORAL | 0 refills | Status: DC
  Filled 2023-10-09: qty 4, 28d supply, fill #0
  Filled 2023-10-09: qty 5, 35d supply, fill #0

## 2023-10-09 NOTE — Progress Notes (Signed)
WEIGHT SUMMARY AND BIOMETRICS  Weight Lost Since Last Visit: 2lb  Weight Gained Since Last Visit: 0   Vitals Temp: 98.1 F (36.7 C) BP: 113/72 Pulse Rate: (!) 58 SpO2: 99 %   Anthropometric Measurements Height: 6\' 1"  (1.854 m) Weight: 267 lb (121.1 kg) BMI (Calculated): 35.23 Weight at Last Visit: 269lb Weight Lost Since Last Visit: 2lb Weight Gained Since Last Visit: 0 Starting Weight: 320lb Total Weight Loss (lbs): 53 lb (24 kg)   Body Composition  Body Fat %: 33.6 % Fat Mass (lbs): 90 lbs Muscle Mass (lbs): 169 lbs Total Body Water (lbs): 127.6 lbs Visceral Fat Rating : 14   Other Clinical Data Fasting: no Labs: no Today's Visit #: 14 Starting Date: 11/27/22    OBESITY Larry [name] is here to discuss his progress with his obesity treatment plan along with follow-up of his obesity related diagnoses.    Nutrition Plan: the Category 4 plan - 80% adherence.  Current exercise: weightlifting and goes to the gym  Interim History:  He is down 2 lbs since his last visit.  Eating all of the food on the plan., Is not skipping meals, Meeting protein goals., and Denies polyphagia   Pharmacotherapy: Larry [name] is on Wegovy 1.0 mg SQ weekly Adverse side effects: None Hunger is well controlled.  Cravings are moderately controlled.  Assessment/Plan:   Vitamin D Deficiency Vitamin D is at goal of 50.  Most recent vitamin D level was 38.5. He is on  prescription ergocalciferol 50,000 IU weekly. Lab Results  Component Value Date   VD25OH 38.5 08/14/2023   VD25OH 25.6 (L) 11/27/2022    Plan: Refill prescription vitamin D 50,000 IU weekly.   Polyphagia Larry [name] endorses excessive hunger.  Medication(s): ZOXWRU Effects of medication:  moderately controlled. Cravings are well controlled.   Plan: Medication(s): Wegovy 1.0 mg SQ weekly Will increase  water, protein and fiber to help assuage hunger.  Will minimize foods that have a high glucose index/load to minimize reactive hypoglycemia.  He will continue to do cardio and resistance training approximately 4 to 6 days/week.    Generalized Obesity: Current BMI BMI (Calculated): 35.23   Pharmacotherapy Plan Continue and refill  Wegovy 1.0 mg SQ weekly  Larry [name] is currently in the action stage of change. As such, his goal is to continue with weight loss efforts.  He has agreed to the Category 4 plan.  Exercise goals: For substantial health benefits, adults should do at least 150 minutes (2 hours and 30 minutes) a week of moderate-intensity, or 75 minutes (1 hour and 15 minutes) a week of vigorous-intensity aerobic physical activity, or an equivalent combination of moderate- and vigorous-intensity aerobic activity. Aerobic activity should be performed in episodes of at least 10 minutes, and preferably, it should be spread throughout the week.  Behavioral modification strategies: increasing lean protein intake, no meal skipping, decrease eating out,  meal planning , ways to avoid night time snacking, and avoiding temptations.  [place] has agreed to follow-up with our clinic in 4 weeks.       Objective:   VITALS: Per patient if applicable, see vitals. GENERAL: Alert and in no acute distress. CARDIOPULMONARY: No increased WOB. Speaking in clear sentences.  PSYCH: Pleasant and cooperative. Speech normal rate and rhythm. Affect is appropriate. Insight and judgement are appropriate. Attention is focused, linear, and appropriate.  NEURO: Oriented as arrived to appointment on time with no prompting.   Attestation Statements:   This was prepared with the assistance of Engineer, civil (consulting).  Occasional wrong-word or sound-a-like substitutions may have occurred due to the inherent limitations of voice recognition   Corinna Capra, DO

## 2023-11-06 ENCOUNTER — Encounter: Payer: Self-pay | Admitting: Bariatrics

## 2023-11-06 ENCOUNTER — Ambulatory Visit: Payer: BC Managed Care – PPO | Admitting: Bariatrics

## 2023-11-06 VITALS — BP 112/75 | HR 62 | Temp 98.1°F | Ht 73.0 in | Wt 260.0 lb

## 2023-11-06 DIAGNOSIS — E559 Vitamin D deficiency, unspecified: Secondary | ICD-10-CM

## 2023-11-06 DIAGNOSIS — R632 Polyphagia: Secondary | ICD-10-CM

## 2023-11-06 DIAGNOSIS — E66811 Obesity, class 1: Secondary | ICD-10-CM

## 2023-11-06 DIAGNOSIS — Z6834 Body mass index (BMI) 34.0-34.9, adult: Secondary | ICD-10-CM | POA: Diagnosis not present

## 2023-11-06 MED ORDER — WEGOVY 1 MG/0.5ML ~~LOC~~ SOAJ: 1.0000 mg | SUBCUTANEOUS | 0 refills | Status: AC

## 2023-11-06 MED ORDER — VITAMIN D (ERGOCALCIFEROL) 1.25 MG (50000 UNIT) PO CAPS: 50000.0000 [IU] | ORAL_CAPSULE | ORAL | 0 refills | Status: DC

## 2023-11-06 NOTE — Progress Notes (Signed)
 WEIGHT SUMMARY AND BIOMETRICS  Weight Lost Since Last Visit: 7 lb  Weight Gained Since Last Visit: 0 lb   Vitals Temp: 98.1 F (36.7 C) BP: 112/75 Pulse Rate: 62 SpO2: 99 %   Anthropometric Measurements Height: 6\' 1"  (1.854 m) Weight: 260 lb (117.9 kg) BMI (Calculated): 34.31 Weight at Last Visit: 267 lb Weight Lost Since Last Visit: 7 lb Weight Gained Since Last Visit: 0 lb Starting Weight: 320 lb Total Weight Loss (lbs): 60 lb (27.2 kg)   Body Composition  Body Fat %: 28.4 % Fat Mass (lbs): 73.8 lbs Muscle Mass (lbs): 177.2 lbs Total Body Water (lbs): 129.6 lbs Visceral Fat Rating : 11   Other Clinical Data Fasting: no Labs: no Today's Visit #: 15 Starting Date: 11/27/22    OBESITY Larry [name] is here to discuss his progress with his obesity treatment plan along with follow-up of his obesity related diagnoses.    Nutrition Plan: the Category 4 plan - 80-85% adherence.  Current exercise: cardiovascular workout on exercise equipment and weightlifting  Interim History:  He is down 7 lbs since his last visit and doing well overall.  Eating all of the food on the plan., Protein intake is as prescribed, and Water intake is inadequate.   Pharmacotherapy: Larry [name] is on Wegovy 1.0 mg SQ weekly Adverse side effects: None Hunger is moderately controlled.  Cravings are moderately controlled.  Assessment/Plan:   1. Vitamin D insufficiency Vitamin D Deficiency Vitamin D is at goal of 50.  Most recent vitamin D level was 38.5. He is on  prescription ergocalciferol 50,000 IU weekly. Lab Results  Component Value Date   VD25OH 38.5 08/14/2023   VD25OH 25.6 (L) 11/27/2022    Plan: Refill prescription vitamin D 50,000 IU weekly.   Polyphagia Larry [name] endorses excessive hunger.  Medication(s): Larry [name] Effects of medication:  moderately controlled.  Cravings are moderately controlled.   Plan: Medication(s): Wegovy 1.0 mg SQ weekly Will increase water, protein and fiber to help assuage hunger.  Will minimize foods that have a high glucose index/load to minimize reactive hypoglycemia.  Will take his lunch to work.    Morbid Obesity: Current BMI BMI (Calculated): 34.31   Pharmacotherapy Plan Continue and refill  Wegovy 1.0 mg SQ weekly  Larry [name] is currently in the action stage of change. As such, his goal is to continue with weight loss efforts.  He has agreed to keeping a food journal with goal of 1,800 calories and 120 + grams of protein daily.  Exercise goals: For substantial health benefits, adults should do at least 150 minutes (2 hours and 30 minutes) a week of moderate-intensity, or 75 minutes (1 hour and 15 minutes) a week of vigorous-intensity aerobic physical activity, or an equivalent combination of moderate- and vigorous-intensity aerobic activity. Aerobic activity should be performed in episodes of at least 10 minutes, and preferably, it should be  spread throughout the week. He is using a "laid back cardio machine for 3 X per week.   Behavioral modification strategies: increasing lean protein intake, decreasing simple carbohydrates , no meal skipping, meal planning , increase water intake, better snacking choices, planning for success, keep healthy foods in the home, and weigh protein portions.  [name] has agreed to follow-up with our clinic in 4 weeks.     Objective:   VITALS: Per patient if applicable, see vitals. GENERAL: Alert and in no acute distress. CARDIOPULMONARY: No increased WOB. Speaking in clear sentences.  PSYCH: Pleasant and cooperative. Speech normal rate and rhythm. Affect is appropriate. Insight and judgement are appropriate. Attention is focused, linear, and appropriate.  NEURO: Oriented as arrived to appointment on time with no prompting.   Attestation Statements:   This was prepared with the  assistance of Engineer, civil (consulting).  Occasional wrong-word or sound-a-like substitutions may have occurred due to the inherent limitations of voice recognition   Larry Capra, DO

## 2023-11-12 ENCOUNTER — Telehealth: Payer: Self-pay

## 2023-11-12 NOTE — Telephone Encounter (Signed)
 PA submitted thorugh Cover My Meds for [facility]. Awaiting insurance determination. Key: [id number]

## 2023-11-13 NOTE — Telephone Encounter (Signed)
 Received fax from [facility] that Reginal Lutes has been approved from 11/12/23-11/11/24.

## 2023-11-14 ENCOUNTER — Encounter: Payer: Self-pay | Admitting: Podiatry

## 2023-12-04 ENCOUNTER — Ambulatory Visit: Payer: BC Managed Care – PPO | Admitting: Bariatrics

## 2023-12-04 ENCOUNTER — Encounter: Payer: Self-pay | Admitting: Bariatrics

## 2023-12-04 VITALS — BP 111/68 | HR 54 | Temp 97.6°F | Ht 73.0 in | Wt 252.0 lb

## 2023-12-04 DIAGNOSIS — Z6833 Body mass index (BMI) 33.0-33.9, adult: Secondary | ICD-10-CM | POA: Diagnosis not present

## 2023-12-04 DIAGNOSIS — E559 Vitamin D deficiency, unspecified: Secondary | ICD-10-CM | POA: Diagnosis not present

## 2023-12-04 DIAGNOSIS — R632 Polyphagia: Secondary | ICD-10-CM

## 2023-12-04 DIAGNOSIS — E669 Obesity, unspecified: Secondary | ICD-10-CM | POA: Diagnosis not present

## 2023-12-04 DIAGNOSIS — E6609 Other obesity due to excess calories: Secondary | ICD-10-CM

## 2023-12-04 MED ORDER — VITAMIN D (ERGOCALCIFEROL) 1.25 MG (50000 UNIT) PO CAPS: 50000.0000 [IU] | ORAL_CAPSULE | ORAL | 0 refills | Status: AC

## 2023-12-04 NOTE — Progress Notes (Signed)
 WEIGHT SUMMARY AND BIOMETRICS  Weight Lost Since Last Visit: 8lb  Weight Gained Since Last Visit: 0   Vitals Temp: 97.6 F (36.4 C) BP: 111/68 Pulse Rate: (!) 54 SpO2: 100 %   Anthropometric Measurements Height: 6\' 1"  (1.854 m) Weight: 252 lb (114.3 kg) BMI (Calculated): 33.25 Weight at Last Visit: 260lb Weight Lost Since Last Visit: 8lb Weight Gained Since Last Visit: 0 Starting Weight: 320lb Total Weight Loss (lbs): 68 lb (30.8 kg)   Body Composition  Body Fat %: 25.4 % Fat Mass (lbs): 64.2 lbs Muscle Mass (lbs): 179.2 lbs Total Body Water (lbs): 129.2 lbs Visceral Fat Rating : 9   Other Clinical Data Fasting: no Labs: no Today's Visit #: 16 Starting Date: 11/27/22    OBESITY Jerrick is here to discuss his progress with his obesity treatment plan along with follow-up of his obesity related diagnoses.    Nutrition Plan: the Category 4 plan - 80-85% adherence.  Current exercise: none  Interim History:  He is down another 8 lbs since his last visit and is doing well. He has stopped the [facility] at this time and feels like he will be alright without the medication.  Eating all of the food on the plan., Protein intake is as prescribed, Is not exceeding snack calorie allotment, Is not skipping meals, Meeting protein goals., and Denies polyphagia   Pharmacotherapy: Kaiven is on Wegovy 1.0 mg SQ weekly Adverse side effects: None Hunger is moderately controlled.  Cravings are moderately controlled.  Assessment/Plan:   Vitamin D Deficiency Vitamin D is not at goal of 50.  Most recent vitamin D level was 38.5. He is on  prescription ergocalciferol 50,000 IU weekly. Lab Results  Component Value Date   VD25OH 38.5 08/14/2023   VD25OH 25.6 (L) 11/27/2022    Plan: Refill prescription vitamin D 50,000 IU weekly.   Polyphagia Latif endorses  excessive hunger.  Medication(s): Stopped the [employer].  Effects of medication:  moderately controlled. Cravings are moderately controlled.   Plan: Medication(s): Has been taking Wegovy, but stopped the medication and would like to see if he can continue without medication.  Will increase water, protein and fiber to help assuage hunger.  Will minimize foods that have a high glucose index/load to minimize reactive hypoglycemia.  He will continue current exercise.     Generalized Obesity: Current BMI BMI (Calculated): 33.25   Lovel is currently in the action stage of change. As such, his goal is to continue with weight loss efforts.  He has agreed to the Category 4 plan.  Exercise goals: For substantial health benefits, adults should do at least 150 minutes (2 hours and 30 minutes) a week of moderate-intensity, or 75 minutes (1 hour and 15 minutes) a week of vigorous-intensity aerobic physical activity, or an equivalent combination of moderate- and vigorous-intensity aerobic activity. Aerobic activity should be performed in episodes  of at least 10 minutes, and preferably, it should be spread throughout the week.  Behavioral modification strategies: increasing lean protein intake, no meal skipping, meal planning , better snacking choices, and planning for success.  Dia has agreed to follow-up with our clinic in 3 months.   Objective:   VITALS: Per patient if applicable, see vitals. GENERAL: Alert and in no acute distress. CARDIOPULMONARY: No increased WOB. Speaking in clear sentences.  PSYCH: Pleasant and cooperative. Speech normal rate and rhythm. Affect is appropriate. Insight and judgement are appropriate. Attention is focused, linear, and appropriate.  NEURO: Oriented as arrived to appointment on time with no prompting.   Attestation Statements:   This was prepared with the assistance of Engineer, civil (consulting).  Occasional wrong-word or sound-a-like substitutions may have  occurred due to the inherent limitations of voice recognition   Corinna Capra, DO

## 2024-01-09 ENCOUNTER — Encounter: Payer: Self-pay | Admitting: Podiatry

## 2024-02-27 ENCOUNTER — Encounter: Payer: Self-pay | Admitting: Podiatry

## 2024-03-04 ENCOUNTER — Ambulatory Visit: Admitting: Bariatrics

## 2024-03-06 ENCOUNTER — Telehealth: Payer: Self-pay

## 2024-03-06 NOTE — Telephone Encounter (Signed)
 Spoke with pt.. 2nd pair of orthotics are here. Pt will pu when he sees Dr. Argyle on 7.21  Orthotics in [place] box  Balance: $271.00

## 2024-03-17 ENCOUNTER — Ambulatory Visit (INDEPENDENT_AMBULATORY_CARE_PROVIDER_SITE_OTHER): Admitting: Podiatry

## 2024-03-17 ENCOUNTER — Encounter: Payer: Self-pay | Admitting: Podiatry

## 2024-03-17 ENCOUNTER — Ambulatory Visit (INDEPENDENT_AMBULATORY_CARE_PROVIDER_SITE_OTHER)

## 2024-03-17 VITALS — Ht 73.0 in | Wt 252.0 lb

## 2024-03-17 DIAGNOSIS — S82852S Displaced trimalleolar fracture of left lower leg, sequela: Secondary | ICD-10-CM | POA: Diagnosis not present

## 2024-03-17 DIAGNOSIS — M79672 Pain in left foot: Secondary | ICD-10-CM | POA: Diagnosis not present

## 2024-03-17 DIAGNOSIS — M216X2 Other acquired deformities of left foot: Secondary | ICD-10-CM

## 2024-03-17 NOTE — Progress Notes (Signed)
  Subjective:  Patient ID: Larry [name], male    DOB: 2000-09-28,  MRN: [id number]  Chief Complaint  Patient presents with   Post-op Follow-up    Rm 7 Patient is here for postop visit of left foot. Patient states pain on lateral side (where hardware is located) painful pulling sensation and on the interior side sensitive to the touch (surgical site).    DOS: 04/06/2023 Procedure: Removal of medial plate, ankle arthroscopy, PRP injection  23 y.o. male returns for post-op check.  Orthotics have been helpful.  He is still taking ibuprofen  fairly regularly somewhere between 800 and 1200 mg daily depending on the day  Review of Systems: Negative except as noted in the HPI. Denies N/V/F/Ch.   Objective:   There were no vitals filed for this visit.  Body mass index is 33.25 kg/m. Constitutional Well developed. Well nourished.  Vascular Foot warm and well perfused. Capillary refill normal to all digits.  Calf is soft and supple, no posterior calf or knee pain, negative Homans' sign  Neurologic Normal speech. Oriented to person, place, and time. Epicritic sensation to light touch grossly present bilaterally.  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 Closed trimalleolar fracture of left ankle, sequela     Plan:  Patient was evaluated and treated and all questions answered.  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  He has quite good range of motion.  He has had significant weight loss which has helped as well.  His pain is not as severe as it  once was.  He does use an ankle brace for support.  We discussed that there are more supportive braces such as an [place]  AFO but this likely would not be very comfortable for him.  We discussed alternating ibuprofen  with Tylenol  as needed and this is still a relatively low dose but if he develops any signs or side effects from the medication such as gastric reflux or urinary symptoms or changes in his routine lab work with his PCP that we would want to discontinue this.  We also discussed topical treatment with CBD and/or Voltaren .  Long-term we also discussed injection therapy if this continues to worsen.  I will see him back in 6 months to continue to monitor his ankle alignment.  A second pair of orthotics were ready today and were dispensed.  These have been helpful but he feels like as the pad wears down his foot and ankle are less stable and become less comfortable  No follow-ups on file.

## 2024-08-18 DIAGNOSIS — J029 Acute pharyngitis, unspecified: Secondary | ICD-10-CM | POA: Diagnosis not present

## 2024-08-18 DIAGNOSIS — R509 Fever, unspecified: Secondary | ICD-10-CM | POA: Diagnosis not present

## 2024-08-18 DIAGNOSIS — R0981 Nasal congestion: Secondary | ICD-10-CM | POA: Diagnosis not present

## 2024-08-18 DIAGNOSIS — J069 Acute upper respiratory infection, unspecified: Secondary | ICD-10-CM | POA: Diagnosis not present

## 2024-08-18 DIAGNOSIS — R059 Cough, unspecified: Secondary | ICD-10-CM | POA: Diagnosis not present

## 2024-09-17 ENCOUNTER — Ambulatory Visit: Admitting: Podiatry

## 2024-09-17 ENCOUNTER — Ambulatory Visit

## 2024-09-17 VITALS — Ht 73.0 in | Wt 252.0 lb

## 2024-09-17 DIAGNOSIS — S82852D Displaced trimalleolar fracture of left lower leg, subsequent encounter for closed fracture with routine healing: Secondary | ICD-10-CM | POA: Diagnosis not present

## 2024-09-17 DIAGNOSIS — S82852S Displaced trimalleolar fracture of left lower leg, sequela: Secondary | ICD-10-CM | POA: Diagnosis not present

## 2024-09-18 ENCOUNTER — Encounter: Payer: Self-pay | Admitting: Podiatry

## 2024-09-18 NOTE — Progress Notes (Signed)
"  °  Subjective:  Patient ID: Larry [name], male    DOB: 07-20-2001,  MRN: [id number]  Chief Complaint  Patient presents with   Fracture    RM 4 Patient is here to f/u on left ankle fracture. Pt states surgical site is sensitive to the touch and pain in the ankle joint during extreme weather.    DOS: 04/06/2023 Procedure: Removal of medial plate, ankle arthroscopy, PRP injection  24 y.o. male returns for post-op check.  Overall doing okay still notes quite a bit of discomfort towards the end of the day mostly medially, has sensitivity over the medial incision is able to go most of the morning and nonwork days without the brace  Review of Systems: Negative except as noted in the HPI. Denies N/V/F/Ch.   Objective:   There were no vitals filed for this visit.  Body mass index is 33.25 kg/m. Constitutional Well developed. Well nourished.  Vascular Foot warm and well perfused. Capillary refill normal to all digits.  Calf is soft and supple, no posterior calf or knee pain, negative Homans' sign  Neurologic Normal speech. Oriented to person, place, and time. Epicritic sensation to light touch grossly present bilaterally.  Dermatologic Non hypertrophic incision.  Sensitive to palpation along the medial side, no pain laterally.  Orthopedic: No reproducible pain in the posterior fibula today on exam he does still have sensitivity around the medial ankle along the incision and mildly when compressing the medial gutter   Multiple view plain film radiographs: No interval change no loosening or complication or fracture work change position of hardware ankle mortise is still well aligned under the leg there are arthritic changes in the medial shoulder Assessment:   1. Closed trimalleolar fracture of left ankle, sequela     Plan:  Patient was evaluated and treated and all questions answered.  He returns for follow-up today with little interval change which I believe for him is positive so  far.  We discussed he does have some arthritic changes in the medial gutter, pain has been relatively well-controlled he has utilize less ibuprofen  and is now supplementing with THC.  Seems to control his pain so far.  Not always dependent on the brace at this point.  Continue weightbearing as tolerated follow-up in 6 months for new x-rays if doing well at that point may be able to transition to yearly x-rays for exam and surveillance.  Long-term we discussed bracing if his pain worsens at all or injection therapy for pain relief.  Return in about 6 months (around 03/17/2025) for left ankle xray.  "

## 2025-03-18 ENCOUNTER — Ambulatory Visit: Admitting: Podiatry
